# Patient Record
Sex: Female | Born: 1990 | Race: Black or African American | Hispanic: No | Marital: Single | Smoking: Former smoker
Health system: Southern US, Community
[De-identification: ages and names within clinical notes are randomized; demographics above are authoritative.]

## PROBLEM LIST (undated history)

## (undated) ENCOUNTER — Inpatient Hospital Stay (HOSPITAL_COMMUNITY): Payer: Self-pay

## (undated) ENCOUNTER — Inpatient Hospital Stay: Payer: Self-pay

## (undated) DIAGNOSIS — R51 Headache: Secondary | ICD-10-CM

## (undated) DIAGNOSIS — N62 Hypertrophy of breast: Secondary | ICD-10-CM

## (undated) DIAGNOSIS — K8020 Calculus of gallbladder without cholecystitis without obstruction: Secondary | ICD-10-CM

## (undated) HISTORY — PX: LIPOSUCTION EXTREMITIES: SUR830

## (undated) HISTORY — DX: Calculus of gallbladder without cholecystitis without obstruction: K80.20

## (undated) HISTORY — PX: WISDOM TOOTH EXTRACTION: SHX21

## (undated) HISTORY — PX: BREAST SURGERY: SHX581

---
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
## 2008-01-11 ENCOUNTER — Emergency Department: Payer: Self-pay | Admitting: Emergency Medicine

## 2009-04-03 ENCOUNTER — Ambulatory Visit: Payer: Self-pay | Admitting: Unknown Physician Specialty

## 2009-06-23 ENCOUNTER — Emergency Department: Payer: Self-pay | Admitting: Unknown Physician Specialty

## 2009-07-14 ENCOUNTER — Emergency Department: Payer: Self-pay | Admitting: Emergency Medicine

## 2009-08-04 ENCOUNTER — Encounter: Payer: Self-pay | Admitting: Specialist

## 2009-08-12 ENCOUNTER — Encounter: Payer: Self-pay | Admitting: Specialist

## 2010-10-27 ENCOUNTER — Emergency Department: Payer: Self-pay | Admitting: Unknown Physician Specialty

## 2011-08-13 DIAGNOSIS — N62 Hypertrophy of breast: Secondary | ICD-10-CM

## 2011-08-13 HISTORY — DX: Hypertrophy of breast: N62

## 2011-08-17 ENCOUNTER — Encounter (HOSPITAL_BASED_OUTPATIENT_CLINIC_OR_DEPARTMENT_OTHER): Payer: Self-pay | Admitting: *Deleted

## 2011-08-24 ENCOUNTER — Encounter (HOSPITAL_BASED_OUTPATIENT_CLINIC_OR_DEPARTMENT_OTHER): Payer: Self-pay | Admitting: *Deleted

## 2011-08-24 ENCOUNTER — Encounter (HOSPITAL_BASED_OUTPATIENT_CLINIC_OR_DEPARTMENT_OTHER): Payer: Self-pay | Admitting: Anesthesiology

## 2011-08-24 ENCOUNTER — Encounter (HOSPITAL_BASED_OUTPATIENT_CLINIC_OR_DEPARTMENT_OTHER)
Admission: RE | Disposition: A | Discharge status: Home / Self Care | Payer: Self-pay | Source: Ambulatory Visit | Attending: Plastic Surgery

## 2011-08-24 ENCOUNTER — Ambulatory Visit (HOSPITAL_BASED_OUTPATIENT_CLINIC_OR_DEPARTMENT_OTHER)
Admission: RE | Admit: 2011-08-24 | Discharge: 2011-08-24 | Disposition: A | Discharge status: Home / Self Care | Payer: BC Managed Care – PPO | Source: Ambulatory Visit | Attending: Plastic Surgery | Admitting: Plastic Surgery

## 2011-08-24 ENCOUNTER — Ambulatory Visit (HOSPITAL_BASED_OUTPATIENT_CLINIC_OR_DEPARTMENT_OTHER): Payer: BC Managed Care – PPO | Admitting: Anesthesiology

## 2011-08-24 DIAGNOSIS — N62 Hypertrophy of breast: Secondary | ICD-10-CM | POA: Diagnosis present

## 2011-08-24 HISTORY — PX: BREAST REDUCTION SURGERY: SHX8

## 2011-08-24 HISTORY — DX: Headache: R51

## 2011-08-24 HISTORY — DX: Hypertrophy of breast: N62

## 2011-08-24 LAB — POCT HEMOGLOBIN-HEMACUE: Hemoglobin: 12.5 g/dL (ref 12.0–15.0)

## 2011-08-24 SURGERY — MAMMOPLASTY, REDUCTION
Anesthesia: General | Site: Breast | Laterality: Bilateral | Wound class: Clean

## 2011-08-24 MED ORDER — BACITRACIN ZINC 500 UNIT/GM EX OINT
TOPICAL_OINTMENT | CUTANEOUS | Status: DC | PRN
  Administered 2011-08-24: 2 via TOPICAL

## 2011-08-24 MED ORDER — CISATRACURIUM BESYLATE 2 MG/ML IV SOLN
INTRAVENOUS | Status: DC | PRN
  Administered 2011-08-24: 12 mg via INTRAVENOUS

## 2011-08-24 MED ORDER — CEFAZOLIN SODIUM 1-5 GM-% IV SOLN
INTRAVENOUS | Status: DC | PRN
  Administered 2011-08-24: 1 g via INTRAVENOUS

## 2011-08-24 MED ORDER — MIDAZOLAM HCL 5 MG/5ML IJ SOLN
INTRAMUSCULAR | Status: DC | PRN
  Administered 2011-08-24: 2 mg via INTRAVENOUS

## 2011-08-24 MED ORDER — PROPOFOL 10 MG/ML IV EMUL
INTRAVENOUS | Status: DC | PRN
  Administered 2011-08-24: 180 mg via INTRAVENOUS

## 2011-08-24 MED ORDER — OXYCODONE-ACETAMINOPHEN 5-325 MG PO TABS
1.0000 | ORAL_TABLET | ORAL | Status: DC | PRN
  Administered 2011-08-24: 1 via ORAL

## 2011-08-24 MED ORDER — FENTANYL CITRATE 0.05 MG/ML IJ SOLN: 100.0000 ug | Freq: Once | INTRAMUSCULAR | Status: DC

## 2011-08-24 MED ORDER — BUPIVACAINE-EPINEPHRINE 0.5% -1:200000 IJ SOLN
INTRAMUSCULAR | Status: DC | PRN
  Administered 2011-08-24: 40 mL

## 2011-08-24 MED ORDER — HYDROMORPHONE HCL PF 1 MG/ML IJ SOLN
0.2500 mg | INTRAMUSCULAR | Status: DC | PRN
  Administered 2011-08-24 (×3): 0.5 mg via INTRAVENOUS

## 2011-08-24 MED ORDER — ONDANSETRON HCL 4 MG/2ML IJ SOLN: 4.0000 mg | Freq: Once | INTRAMUSCULAR | Status: DC | PRN

## 2011-08-24 MED ORDER — MIDAZOLAM HCL 2 MG/2ML IJ SOLN: 2.0000 mg | Freq: Once | INTRAMUSCULAR | Status: DC

## 2011-08-24 MED ORDER — ONDANSETRON HCL 4 MG/2ML IJ SOLN
INTRAMUSCULAR | Status: DC | PRN
  Administered 2011-08-24: 4 mg via INTRAVENOUS

## 2011-08-24 MED ORDER — LACTATED RINGERS IV SOLN
INTRAVENOUS | Status: DC
  Administered 2011-08-24 (×3): via INTRAVENOUS

## 2011-08-24 MED ORDER — DEXAMETHASONE SODIUM PHOSPHATE 4 MG/ML IJ SOLN
INTRAMUSCULAR | Status: DC | PRN
  Administered 2011-08-24 (×2): 10 mg via INTRAVENOUS

## 2011-08-24 MED ORDER — 0.9 % SODIUM CHLORIDE (POUR BTL) OPTIME
TOPICAL | Status: DC | PRN
  Administered 2011-08-24 (×2): 450 mL

## 2011-08-24 MED ORDER — FENTANYL CITRATE 0.05 MG/ML IJ SOLN
INTRAMUSCULAR | Status: DC | PRN
  Administered 2011-08-24: 50 ug via INTRAVENOUS
  Administered 2011-08-24: 25 ug via INTRAVENOUS
  Administered 2011-08-24: 75 ug via INTRAVENOUS
  Administered 2011-08-24: 25 ug via INTRAVENOUS
  Administered 2011-08-24: 100 ug via INTRAVENOUS
  Administered 2011-08-24: 50 ug via INTRAVENOUS
  Administered 2011-08-24 (×2): 25 ug via INTRAVENOUS
  Administered 2011-08-24: 50 ug via INTRAVENOUS
  Administered 2011-08-24: 25 ug via INTRAVENOUS
  Administered 2011-08-24 (×2): 100 ug via INTRAVENOUS

## 2011-08-24 MED ORDER — LIDOCAINE-EPINEPHRINE 1 %-1:100000 IJ SOLN
INTRAMUSCULAR | Status: DC | PRN
  Administered 2011-08-24: 40 mL

## 2011-08-24 SURGICAL SUPPLY — 58 items
BANDAGE GAUZE ELAST BULKY 4 IN (GAUZE/BANDAGES/DRESSINGS) ×4 IMPLANT
BENZOIN TINCTURE PRP APPL 2/3 (GAUZE/BANDAGES/DRESSINGS) IMPLANT
BLADE KNIFE PERSONA 10 (BLADE) ×8 IMPLANT
BLADE KNIFE PERSONA 15 (BLADE) ×6 IMPLANT
CANISTER SUCTION 1200CC (MISCELLANEOUS) ×2 IMPLANT
CAP BOUFFANT 24 BLUE NURSES (PROTECTIVE WEAR) ×2 IMPLANT
CLOTH BEACON ORANGE TIMEOUT ST (SAFETY) ×2 IMPLANT
COVER MAYO STAND STRL (DRAPES) ×2 IMPLANT
COVER TABLE BACK 60X90 (DRAPES) ×2 IMPLANT
DECANTER SPIKE VIAL GLASS SM (MISCELLANEOUS) ×4 IMPLANT
DRAIN CHANNEL 10F 3/8 F FF (DRAIN) ×4 IMPLANT
DRAPE LAPAROSCOPIC ABDOMINAL (DRAPES) ×2 IMPLANT
DRSG EMULSION OIL 3X3 NADH (GAUZE/BANDAGES/DRESSINGS) ×4 IMPLANT
DRSG PAD ABDOMINAL 8X10 ST (GAUZE/BANDAGES/DRESSINGS) ×4 IMPLANT
ELECT NEEDLE TIP 2.8 STRL (NEEDLE) IMPLANT
ELECT REM PT RETURN 9FT ADLT (ELECTROSURGICAL) ×2
ELECTRODE REM PT RTRN 9FT ADLT (ELECTROSURGICAL) ×1 IMPLANT
EVACUATOR SILICONE 100CC (DRAIN) ×4 IMPLANT
FILTER 7/8 IN (FILTER) ×2 IMPLANT
GLOVE BIO SURGEON STRL SZ 6.5 (GLOVE) ×6 IMPLANT
GLOVE BIOGEL PI IND STRL 6.5 (GLOVE) IMPLANT
GLOVE BIOGEL PI INDICATOR 6.5 (GLOVE)
GLOVE ECLIPSE 6.5 STRL STRAW (GLOVE) ×2 IMPLANT
GOWN PREVENTION PLUS XLARGE (GOWN DISPOSABLE) ×4 IMPLANT
GOWN PREVENTION PLUS XXLARGE (GOWN DISPOSABLE) IMPLANT
NEEDLE HYPO 25X1 1.5 SAFETY (NEEDLE) ×2 IMPLANT
NEEDLE SPNL 18GX3.5 QUINCKE PK (NEEDLE) ×2 IMPLANT
NS IRRIG 1000ML POUR BTL (IV SOLUTION) ×4 IMPLANT
PACK BASIN DAY SURGERY FS (CUSTOM PROCEDURE TRAY) ×2 IMPLANT
PIN SAFETY STERILE (MISCELLANEOUS) ×2 IMPLANT
SCRUB PCMX 4 OZ (MISCELLANEOUS) IMPLANT
SCRUB TECHNI CARE SURGICAL (MISCELLANEOUS) ×2 IMPLANT
SLEEVE SCD COMPRESS KNEE MED (MISCELLANEOUS) ×2 IMPLANT
SLEEVE SURGEON STRL (DRAPES) ×2 IMPLANT
SPECIMEN JAR MEDIUM (MISCELLANEOUS) ×4 IMPLANT
SPECIMEN JAR X LARGE (MISCELLANEOUS) IMPLANT
SPONGE GAUZE 4X4 12PLY (GAUZE/BANDAGES/DRESSINGS) ×4 IMPLANT
SPONGE LAP 18X18 X RAY DECT (DISPOSABLE) ×6 IMPLANT
STAPLER VISISTAT 35W (STAPLE) ×4 IMPLANT
STRIP CLOSURE SKIN 1/2X4 (GAUZE/BANDAGES/DRESSINGS) ×8 IMPLANT
SUT ETHILON 3 0 PS 1 (SUTURE) ×2 IMPLANT
SUT MNCRL AB 3-0 PS2 18 (SUTURE) ×8 IMPLANT
SUT MNCRL AB 4-0 PS2 18 (SUTURE) ×4 IMPLANT
SUT MON AB 5-0 PS2 18 (SUTURE) ×4 IMPLANT
SUT PROLENE 2 0 CT2 30 (SUTURE) ×2 IMPLANT
SUT PROLENE 3 0 PS 1 (SUTURE) ×4 IMPLANT
SUT QUILL PDO 2-0 (SUTURE) ×4 IMPLANT
SYR BULB IRRIGATION 50ML (SYRINGE) ×4 IMPLANT
SYR CONTROL 10ML LL (SYRINGE) ×6 IMPLANT
TOWEL OR 17X24 6PK STRL BLUE (TOWEL DISPOSABLE) ×6 IMPLANT
TOWEL OR NON WOVEN STRL DISP B (DISPOSABLE) ×2 IMPLANT
TRAY DSU PREP LF (CUSTOM PROCEDURE TRAY) ×2 IMPLANT
TRAY FOLEY CATH 14FR (SET/KITS/TRAYS/PACK) ×2 IMPLANT
TUBE CONNECTING 20X1/4 (TUBING) ×2 IMPLANT
UNDERPAD 30X30 INCONTINENT (UNDERPADS AND DIAPERS) ×4 IMPLANT
VAC PENCILS W/TUBING CLEAR (MISCELLANEOUS) ×2 IMPLANT
WATER STERILE IRR 1000ML POUR (IV SOLUTION) ×2 IMPLANT
YANKAUER SUCT BULB TIP NO VENT (SUCTIONS) ×2 IMPLANT

## 2011-08-24 NOTE — Brief Op Note (Signed)
08/24/2011  12:11 PM  PATIENT:  Elizabeth Frey  21 y.o. female  PRE-OPERATIVE DIAGNOSIS:  macromastia  POST-OPERATIVE DIAGNOSIS:  macromastia  PROCEDURE:  Procedure(s) (LRB): MAMMARY REDUCTION BILATERAL (BREAST) (Bilateral)  SURGEON:  Surgeon(s) and Role:    * Lamar Blinks Riggs Dineen, MD - Primary  ASSISTANTS:None  ANESTHESIA:   general  EBL:  Total I/O In: 2200 [I.V.:2200] Out: 1500 [Urine:1100; Blood:400]  DRAINS: (53F) Jackson-Pratt drain(s) with closed bulb suction in the Breast   LOCAL MEDICATIONS USED:  MARCAINE     SPECIMEN:  Source of Specimen:  Breast  DISPOSITION OF SPECIMEN:  PATHOLOGY  COUNTS:  YES  DICTATION: .Other Dictation: Dictation Number [id number]  PLAN OF CARE: Discharge to home after PACU  PATIENT DISPOSITION:  PACU - hemodynamically stable.   Delay start of Pharmacological VTE agent (>24hrs) due to surgical blood loss or risk of bleeding: not applicable

## 2011-08-24 NOTE — Anesthesia Postprocedure Evaluation (Signed)
Anesthesia Post Note  Patient: Elizabeth Frey  Procedure(s) Performed: Procedure(s) (LRB): MAMMARY REDUCTION BILATERAL (BREAST) (Bilateral)  Anesthesia type: General  Patient location: PACU  Post pain: Pain level controlled  Post assessment: Patient's Cardiovascular Status Stable  Last Vitals:  Filed Vitals:   08/24/11 1415  BP: 116/64  Pulse: 103  Temp: 37.1 C  Resp: 20    Post vital signs: Reviewed and stable  Level of consciousness: alert  Complications: No apparent anesthesia complications

## 2011-08-24 NOTE — Discharge Instructions (Signed)
1. No lifting, pushing or pulling with arms greater than 5 lbs for 4 weeks. 2. Empty, strip, record and reactivate JP drains three times a day. 3. Percocet 5/325 tabs 1-2 po q 4-6 hours prn pain (prescription given in office). 4. Duricef 500 mg tabs 1 tab po q 12 hors (prescription given in office). 5. Sterapred dose pack as directed (prescription given in office). 6. Follow-up appointment Friday in office.    About my Jackson-Pratt Bulb Drain  What is a Jackson-Pratt bulb? A Jackson-Pratt is a soft, round device used to collect drainage. It is connected to a long, thin drainage catheter, which is held in place by one or two small stiches near your surgical incision site. When the bulb is squeezed, it forms a vacuum, forcing the drainage to empty into the bulb.  Emptying the Jackson-Pratt bulb- To empty the bulb: 1. Release the plug on the top of the bulb. 2. Pour the bulb's contents into a measuring container which your nurse will provide. 3. Record the time emptied and amount of drainage. Empty the drain(s) as often as your     doctor or nurse recommends.  Date                  Time                    Amount (Drain 1)                 Amount (Drain 2)  _____________________________________________________________________  _____________________________________________________________________  _____________________________________________________________________  _____________________________________________________________________  _____________________________________________________________________  _____________________________________________________________________  _____________________________________________________________________  _____________________________________________________________________  Squeezing the Jackson-Pratt Bulb- To squeeze the bulb: 1. Make sure the plug at the top of the bulb is open. 2. Squeeze the bulb tightly in your fist. You will hear air  squeezing from the bulb. 3. Replace the plug while the bulb is squeezed. 4. Use a safety pin to attach the bulb to your clothing. This will keep the catheter from     pulling at the bulb insertion site.  When to call your doctor- Call your doctor if:  Drain site becomes red, swollen or hot.  You have a fever greater than 101 degrees F.  There is oozing at the drain site.  Drain falls out (apply a guaze bandage over the drain hole and secure it with tape).  Drainage increases daily not related to activity patterns. (You will usually have more drainage when you are active than when you are resting.)  Drainage has a bad odor.

## 2011-08-24 NOTE — Transfer of Care (Signed)
Immediate Anesthesia Transfer of Care Note  Patient: Elizabeth Frey  Procedure(s) Performed: Procedure(s) (LRB): MAMMARY REDUCTION BILATERAL (BREAST) (Bilateral)  Patient Location: PACU  Anesthesia Type: General  Level of Consciousness: sedated  Airway & Oxygen Therapy: Patient Spontanous Breathing and Patient connected to face mask oxygen  Post-op Assessment: Report given to PACU RN and Post -op Vital signs reviewed and stable  Post vital signs: Reviewed and stable  Complications: No apparent anesthesia complications

## 2011-08-24 NOTE — Anesthesia Procedure Notes (Signed)
Procedure Name: Intubation Date/Time: 08/24/2011 8:22 AM Performed by: Gar Gibbon Pre-anesthesia Checklist: Patient identified, Emergency Drugs available, Suction available and Patient being monitored Patient Re-evaluated:Patient Re-evaluated prior to inductionOxygen Delivery Method: Circle system utilized Preoxygenation: Pre-oxygenation with 100% oxygen Intubation Type: IV induction Ventilation: Mask ventilation without difficulty Laryngoscope Size: Mac and 3 Grade View: Grade I Tube type: Oral Tube size: 7.0 mm Number of attempts: 1 Placement Confirmation: ETT inserted through vocal cords under direct vision,  positive ETCO2 and breath sounds checked- equal and bilateral Secured at: 21 cm Tube secured with: Tape Dental Injury: Teeth and Oropharynx as per pre-operative assessment

## 2011-08-24 NOTE — H&P (Signed)
  H&P faxed to surgical center. The patient has been re-examined, the H&P has been reviewed and there is no change in the plan of care.

## 2011-08-24 NOTE — Anesthesia Preprocedure Evaluation (Signed)
Anesthesia Evaluation  Patient identified by MRN, date of birth, ID band Patient awake    Reviewed: Allergy & Precautions, H&P , NPO status   Airway Mallampati: I TM Distance: >3 FB Neck ROM: Full    Dental  (+) Teeth Intact   Pulmonary  breath sounds clear to auscultation        Cardiovascular Rhythm:Regular Rate:Normal     Neuro/Psych    GI/Hepatic   Endo/Other    Renal/GU      Musculoskeletal   Abdominal   Peds  Hematology   Anesthesia Other Findings   Reproductive/Obstetrics                           Anesthesia Physical Anesthesia Plan  ASA: I  Anesthesia Plan: General   Post-op Pain Management:    Induction: Intravenous  Airway Management Planned: Oral ETT  Additional Equipment:   Intra-op Plan:   Post-operative Plan: Extubation in OR  Informed Consent:   Dental advisory given  Plan Discussed with: CRNA and Surgeon  Anesthesia Plan Comments: (ASA 1  Plan GA with ETT  Kipp Brood, MD)        Anesthesia Quick Evaluation

## 2011-08-25 ENCOUNTER — Encounter (HOSPITAL_BASED_OUTPATIENT_CLINIC_OR_DEPARTMENT_OTHER): Payer: Self-pay | Admitting: Plastic Surgery

## 2011-08-25 NOTE — Progress Notes (Signed)
Pt c/o headaches.  States that she is not eating much because her pain medicine seems to be giving her the nausea.  Instructed pt to call Dr Valene Bors office and let them know how she is feeling and see if she can get something for her nausea.

## 2011-08-25 NOTE — Op Note (Signed)
NAME:  Elizabeth Frey, Elizabeth Frey               ACCOUNT NO.:  MEDICAL RECORD NO.:  [id number]  LOCATION:                                 FACILITY:  PHYSICIAN:  Brantley Persons, M.D.DATE OF BIRTH:  1991/04/30  DATE OF PROCEDURE:  08/24/2011 DATE OF DISCHARGE:                              OPERATIVE REPORT   PREOPERATIVE DIAGNOSIS:  Bilateral macromastia.  POSTOPERATIVE DIAGNOSIS:  Bilateral macromastia.  PROCEDURE:  Bilateral reduction mammoplasties.  ATTENDING SURGEON:  Brantley Persons, MD  ANESTHESIA:  General.  ANESTHESIOLOGIST:  Guadalupe Maple, MD  COMPLICATIONS:  None.  FLUID REPLACEMENT:  2000 mL crystalloid.  URINE OUTPUT:  1100 mL.  ESTIMATED BLOOD LOSS:  400 mL.  COMPLICATIONS:  None.  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  She presents to undergo bilateral reduction mammoplasties.  PROCEDURE:  The patient was marked in the preop holding area in the pattern of Wise for the future bilateral reduction mammoplasties.  She was then taken back to the OR and placed on the table in supine position.  After adequate general anesthesia was obtained, the patient's chest was prepped with Techni-Care and draped in sterile fashion.  The bases of the breast had been injected with 1% lidocaine with epinephrine.  After adequate hemostasis and anesthesia taken obtained, the procedure was begun.  Both of the breast reductions were done in the following similar manner. The nipple-areolar complex was marked with 45-mm nipple marker.  The skin was then incised and de-epithelialized around the nipple-areolar complex and taken down to an inframammary crease in inferior pedicle pattern.  Next, the medial, superior, and lateral skin flaps were elevated down to the chest wall.  The excess fat and glandular tissue were removed from the inferior pedicle.  The nipple-areolar  complex was examined and found to be pink and viable.  The wound was then irrigated with saline irrigation.  Meticulous hemostasis was obtained with the Bovie electrocautery.  The inferior pedicle was then centralized using 3- 0 Prolene suture.  A #10 JP flat fully-fluted drain was placed into the wound.  The skin flaps were brought together at the inverted T junction with a 2-0 Prolene suture.  The incision was then stapled for temporary closure.  The breasts were compared and found to have good shape and symmetry.  The incision was then closed from the medial aspect of the JP drain to the medial aspect of the [facility] incision by first placing a few 3- 0 Monocryl sutures to loosely tack together the dermal layer and then both the dermal and cuticular layer were closed in a single layer using a 2-0 Quill PDO barbed suture.  Lateral to the JP drain, the incision was closed using 3-0 Monocryl in the dermal layer followed by 3-0 Monocryl running intracuticular stitch on the skin.  The vertical limb of the Wise pattern was closed in the dermal layer using 3-0 Monocryl suture.  The patient was placed in the upright position.  The future location of the nipple-areolar complex was marked on both breast mounds.  The patient was then placed back into recumbent position.  Both of  the nipple-areolar complexes were brought out onto the breast mounds in the following similar manner.  The skin was incised as marked and removed full thickness into the subcutaneous tissues.  The nipple- areolar complex was examined, found to be pink and viable and then brought out through the aperture sewn in place using 4-0 Monocryl in the dermal layer followed by a 5-0 Monocryl running intracuticular stitch on the skin.  The 5-0 Monocryl suture was then brought down to close the cuticular layer of the vertical limb as well.  The JP drain was sewn in place using 3-0 nylon suture.  Prior to closing the breast wound,  the pectoris major muscle and fascia and surrounding tissues were infiltrated with 0.5% Marcaine with epinephrine, and then the rest of the incision in breast soft tissues were infiltrated with 0.5% Marcaine with epinephrine as well, at this point, to provide a postsurgical anesthetic block.  The incisions were dressed with Steri-Strips and the nipples additionally with bacitracin ointment and Adaptic.  4x4s were placed over the incisions, and the patient was placed into light postoperative support bra.  There were no complications.  The patient tolerated the procedure well.  The final needle and sponge counts were reported to be correct at the end of the case as well.  The patient was taken to recovery room in stable condition.  She was recovered without complications.  Both the patient and her family were given proper postoperative wound care instructions including care of the JP drains.  She was then discharged in the care of her family in stable condition.  Followup appointment will be Friday in the office.          ______________________________ Brantley Persons, M.D.     MC/MEDQ  D:  08/24/2011  T:  08/25/2011  Job:  [id number]

## 2012-03-07 ENCOUNTER — Emergency Department: Payer: Self-pay | Admitting: Emergency Medicine

## 2012-03-07 LAB — CBC WITH DIFFERENTIAL/PLATELET
Basophil #: 0.1 10*3/uL (ref 0.0–0.1)
Lymphocyte #: 3 10*3/uL (ref 1.0–3.6)
Monocyte %: 7 %
Neutrophil #: 6.6 10*3/uL — ABNORMAL HIGH (ref 1.4–6.5)
RBC: 3.81 10*6/uL (ref 3.80–5.20)
RDW: 12.6 % (ref 11.5–14.5)
WBC: 10.5 10*3/uL (ref 3.6–11.0)

## 2012-03-07 LAB — URINALYSIS, COMPLETE
Bilirubin,UR: NEGATIVE
Blood: NEGATIVE
Leukocyte Esterase: NEGATIVE
Nitrite: NEGATIVE
Ph: 5 (ref 4.5–8.0)
Protein: NEGATIVE
RBC,UR: 1 /HPF (ref 0–5)
Squamous Epithelial: 41

## 2012-03-07 LAB — BASIC METABOLIC PANEL
Anion Gap: 10 (ref 7–16)
BUN: 7 mg/dL (ref 7–18)
Creatinine: 0.65 mg/dL (ref 0.60–1.30)
EGFR (African American): >60
EGFR (Non-African Amer.): >60
Sodium: 138 mmol/L (ref 136–145)

## 2012-05-16 ENCOUNTER — Observation Stay: Payer: Self-pay | Admitting: Obstetrics and Gynecology

## 2012-05-17 LAB — URINALYSIS, COMPLETE
Blood: NEGATIVE
Nitrite: NEGATIVE
Protein: NEGATIVE
Specific Gravity: 1.025 (ref 1.003–1.030)

## 2012-07-20 ENCOUNTER — Observation Stay: Payer: Self-pay | Admitting: Obstetrics and Gynecology

## 2012-07-20 LAB — PIH PROFILE
Anion Gap: 7 (ref 7–16)
Co2: 24 mmol/L (ref 21–32)
MCV: 92 fL (ref 80–100)
Osmolality: 277 (ref 275–301)
Potassium: 3 mmol/L — ABNORMAL LOW (ref 3.5–5.1)
Uric Acid: 4.7 mg/dL (ref 2.6–6.0)

## 2012-07-20 LAB — PROTEIN / CREATININE RATIO, URINE: Protein/Creat. Ratio: 192 mg/gCREAT (ref 0–200)

## 2012-07-24 ENCOUNTER — Inpatient Hospital Stay: Payer: Self-pay

## 2012-07-24 LAB — BASIC METABOLIC PANEL
Co2: 23 mmol/L (ref 21–32)
EGFR (Non-African Amer.): >60
Osmolality: 277 (ref 275–301)
Potassium: 3.4 mmol/L — ABNORMAL LOW (ref 3.5–5.1)

## 2012-07-24 LAB — URIC ACID: Uric Acid: 4.6 mg/dL (ref 2.6–6.0)

## 2012-07-24 LAB — CBC WITH DIFFERENTIAL/PLATELET
Eosinophil #: 0 10*3/uL (ref 0.0–0.7)
HGB: 11.5 g/dL — ABNORMAL LOW (ref 12.0–16.0)
Lymphocyte #: 1.7 10*3/uL (ref 1.0–3.6)
Neutrophil #: 4.7 10*3/uL (ref 1.4–6.5)
Neutrophil %: 67.2 %
Platelet: 95 10*3/uL — ABNORMAL LOW (ref 150–440)
RBC: 3.75 10*6/uL — ABNORMAL LOW (ref 3.80–5.20)
WBC: 7 10*3/uL (ref 3.6–11.0)

## 2012-07-25 LAB — PLATELET COUNT: Platelet: 102 10*3/uL — ABNORMAL LOW (ref 150–440)

## 2012-07-26 LAB — HEMATOCRIT: HCT: 27.1 % — ABNORMAL LOW (ref 35.0–47.0)

## 2012-07-28 LAB — PATHOLOGY REPORT

## 2013-08-02 ENCOUNTER — Emergency Department: Payer: Self-pay | Admitting: Internal Medicine

## 2013-08-02 LAB — COMPREHENSIVE METABOLIC PANEL
AST: 21 U/L (ref 15–37)
Albumin: 3.8 g/dL (ref 3.4–5.0)
Alkaline Phosphatase: 132 U/L — ABNORMAL HIGH
Anion Gap: 4 — ABNORMAL LOW (ref 7–16)
BILIRUBIN TOTAL: 1.5 mg/dL — AB (ref 0.2–1.0)
BUN: 11 mg/dL (ref 7–18)
CALCIUM: 9.1 mg/dL (ref 8.5–10.1)
Chloride: 104 mmol/L (ref 98–107)
Co2: 28 mmol/L (ref 21–32)
Creatinine: 0.65 mg/dL (ref 0.60–1.30)
EGFR (African American): >60
Glucose: 89 mg/dL (ref 65–99)
Osmolality: 271 (ref 275–301)
POTASSIUM: 4 mmol/L (ref 3.5–5.1)
SGPT (ALT): 23 U/L (ref 12–78)
Sodium: 136 mmol/L (ref 136–145)
TOTAL PROTEIN: 8.2 g/dL (ref 6.4–8.2)

## 2013-08-02 LAB — CBC
HCT: 39.8 % (ref 35.0–47.0)
HGB: 13.7 g/dL (ref 12.0–16.0)
MCH: 32.2 pg (ref 26.0–34.0)
MCHC: 34.4 g/dL (ref 32.0–36.0)
MCV: 94 fL (ref 80–100)
Platelet: 167 10*3/uL (ref 150–440)
RBC: 4.25 10*6/uL (ref 3.80–5.20)
RDW: 13.3 % (ref 11.5–14.5)
WBC: 9.2 10*3/uL (ref 3.6–11.0)

## 2013-08-02 LAB — URINALYSIS, COMPLETE
BILIRUBIN, UR: NEGATIVE
Bacteria: NONE SEEN
Blood: NEGATIVE
Glucose,UR: NEGATIVE mg/dL (ref 0–75)
Leukocyte Esterase: NEGATIVE
NITRITE: NEGATIVE
PH: 6 (ref 4.5–8.0)
Protein: NEGATIVE
Specific Gravity: 1.015 (ref 1.003–1.030)
WBC UR: 4 /HPF (ref 0–5)

## 2013-08-03 ENCOUNTER — Emergency Department: Payer: Self-pay | Admitting: Emergency Medicine

## 2013-08-03 LAB — CBC WITH DIFFERENTIAL/PLATELET
BASOS PCT: 0.5 %
Basophil #: 0 10*3/uL (ref 0.0–0.1)
EOS ABS: 0 10*3/uL (ref 0.0–0.7)
EOS PCT: 1 %
HCT: 37.5 % (ref 35.0–47.0)
HGB: 12.8 g/dL (ref 12.0–16.0)
LYMPHS PCT: 42.1 %
Lymphocyte #: 1.8 10*3/uL (ref 1.0–3.6)
MCH: 31.9 pg (ref 26.0–34.0)
MCHC: 34.2 g/dL (ref 32.0–36.0)
MCV: 93 fL (ref 80–100)
MONO ABS: 0.7 x10 3/mm (ref 0.2–0.9)
Monocyte %: 15.6 %
NEUTROS PCT: 40.8 %
Neutrophil #: 1.8 10*3/uL (ref 1.4–6.5)
PLATELETS: 168 10*3/uL (ref 150–440)
RBC: 4.02 10*6/uL (ref 3.80–5.20)
RDW: 13.1 % (ref 11.5–14.5)
WBC: 4.3 10*3/uL (ref 3.6–11.0)

## 2013-08-03 LAB — COMPREHENSIVE METABOLIC PANEL
ALBUMIN: 3.5 g/dL (ref 3.4–5.0)
ALK PHOS: 113 U/L
ALT: 18 U/L (ref 12–78)
ANION GAP: 5 — AB (ref 7–16)
AST: 22 U/L (ref 15–37)
BUN: 12 mg/dL (ref 7–18)
Bilirubin,Total: 0.7 mg/dL (ref 0.2–1.0)
CHLORIDE: 107 mmol/L (ref 98–107)
Calcium, Total: 8.2 mg/dL — ABNORMAL LOW (ref 8.5–10.1)
Co2: 28 mmol/L (ref 21–32)
Creatinine: 0.86 mg/dL (ref 0.60–1.30)
EGFR (Non-African Amer.): >60
GLUCOSE: 91 mg/dL (ref 65–99)
Osmolality: 279 (ref 275–301)
Potassium: 3 mmol/L — ABNORMAL LOW (ref 3.5–5.1)
Sodium: 140 mmol/L (ref 136–145)
TOTAL PROTEIN: 7.4 g/dL (ref 6.4–8.2)

## 2013-08-03 LAB — LIPASE, BLOOD: LIPASE: 124 U/L (ref 73–393)

## 2013-08-04 LAB — URINALYSIS, COMPLETE
Bacteria: NONE SEEN
Bilirubin,UR: NEGATIVE
Blood: NEGATIVE
Glucose,UR: NEGATIVE mg/dL (ref 0–75)
KETONE: NEGATIVE
LEUKOCYTE ESTERASE: NEGATIVE
NITRITE: NEGATIVE
PH: 6 (ref 4.5–8.0)
RBC,UR: 2 /HPF (ref 0–5)
SPECIFIC GRAVITY: 1.028 (ref 1.003–1.030)
WBC UR: 4 /HPF (ref 0–5)

## 2014-10-04 NOTE — Op Note (Signed)
PATIENT NAME:  Elizabeth Frey, Elizabeth Frey MR#:  [id number] DATE OF BIRTH:  1991-02-21  DATE OF PROCEDURE:  07/25/2012  PREOPERATIVE DIAGNOSIS: Fetal intolerance to labor.   POSTOPERATIVE DIAGNOSIS: Fetal intolerance to labor.   PROCEDURE: Primary low transverse cesarean section.   SURGEON: Senaida Lange, M.D.   ASSISTANT: Channing Mutters, scrub tech.   ANESTHESIA: Spinal.   ESTIMATED BLOOD LOSS: 500 mL   OPERATIVE FLUIDS:  2300 mL.   COMPLICATIONS: None.   FINDINGS: Vertex female infant 1930 grams, Apgars 9 and 9, normal uterus, tubes and ovaries.   SPECIMENS: Placenta.   INDICATIONS: The patient is a 24 year old who presents for induction of labor for gestational hypertension, IUGR and oligohydramnios who presents for induction of labor. The patient had Cervidil placed the night of admission.  She progressed to 1 cm.  EZ catheter was placed the following morning and Pitocin was started to continue her induction.  The patient got to approximately 5 cm and her fetus began to have decelerations without return to baseline. Therefore the decision was made to proceed towards cesarean section for fetal intolerance to labor.  The risks, benefits indications and alternatives of the procedure were explained and informed consent was obtained.   DESCRIPTION OF PROCEDURE: The patient was taken to the operating room with IV fluids running. She was prepped and draped in the usual sterile fashion with a leftward tilt. Pfannenstiel skin incision was made and carried down to underlying fascia with a knife. The fascia was nicked in the midline. The incision was extended laterally. The superior aspect of the fascia was grasped with Kochers and the underlying rectus muscle dissected off. This was repeated on the inferior fascia. The fascia was entered sharply with the Metzenbaum scissors and the opening was extended. The bladder blade was placed. The vesicouterine peritoneum was grasped with pick-ups and entered  sharply with Metzenbaum scissors. The bladder flap was created digitally. The bladder blade was replaced. A hysterotomy incision was made, carried down to underlying fetal membranes. The opening was extended. The infant's head was grasped and delivered atraumatically through the hysterotomy incision. The mouth and nose were bulb suctioned. The anterior and posterior shoulders were delivered followed by the remainder of the body. The cord was clamped x 2 and cut and the infant was handed to the awaiting neonatologist. The placenta was expressed.   The uterus was exteriorized and cleared of all clot and debris. A right cervical tear was noted.  This was repaired in a running locked fashion as was the remainder of the uterine incision. All surgical areas were found to be hemostatic. The uterus was returned to the abdomen. The abdomen and gutters were irrigated with copious amounts of warm normal saline. The peritoneum was repaired with 2-0 Vicryl. The On-Q pump apparatus was placed according to manufacturer's instructions. The fascia was closed with #1 PDS. The skin was closed with 4-0 Vicryl.  The opening in the skin through which the catheters were placed were secured with Dermabond skin glue. The catheters were bolused with 0.5% Sensorcaine plain x 10 mL and the catheters were secured to the patient's abdomen using Steri-Strips and Tegaderm. The patient tolerated procedure well. Sponge, needle and instrument counts were correct x 2. The patient was taken to the recovery room in stable condition.    ____________________________ Sonda Primes Patton Salles, MD law:ct D: 07/25/2012 11:59:00 ET T: 07/25/2012 12:54:57 ET JOB#: [id number]  cc: Flint Melter A. Patton Salles, MD, <Dictator> Janyth Contes LEE MD ELECTRONICALLY SIGNED 08/02/2012 8:54

## 2014-10-22 NOTE — H&P (Signed)
L&D Evaluation:  History:  HPI 24 yo G2P010 at [redacted]w[redacted]d by [facility] of 08/11/12 presenting from clinic for IOL for HTN, IUGR at 3.5%ile on U/S today.  First elevated BPs were noticed last week and she was hospitalized for preE evaluation at which point she was found to have negative proteinuria and normal labs.  BP in office today 140/102.  She notes a mild HA today but declines VC and RUQ pain.  She notes good FM.  Denies LOF, VB, ctx.  PNC at [facility].  PNL - A+, RI, VI, GBS negative.   Presents with IOL   Patient's Medical History No Chronic Illness   Patient's Surgical History none   Medications Pre Natal Vitamins   Allergies NKDA   Social History none   Family History Non-Contributory   ROS:  ROS per HPI   Exam:  Vital Signs BP 158/89   Urine Protein not completed   General no apparent distress   Mental Status clear   Chest no increased work of breathing   Abdomen gravid, non-tender   Estimated Fetal Weight Small for gestational age   Fetal Position vtx   Edema 1+   Reflexes 2+   Clonus negative   Pelvic cl / 90 / -1   FHT normal rate with no decels   Ucx absent   Skin dry   Other U/S today at [facility]: 3.5%ile with AFI 7cm   Impression:  Impression G2P0 at [redacted]w[redacted]d presents for IOL for likely preE based on elevated BPs and IUGR.   Plan:  Plan EFM/NST, monitor BP, PIH panel   Comments - preE at term - based on elevated BPs and IUGR, 24hr urine results pending (returned to office today), start IOL with cervidil, monitor BPs, will treat for severe range BPs, will consider magnesium in active phase if BPs remain elevated   Electronic Signatures for Addendum Section:  Orvan Falconer (MD) (Signed Addendum 10-Feb-14 14:10)  PreE labs returned WNL, but plts still pending.  BPs remain high mild range (single severe range BP).  Will plan to treat with Magnesium sulfate in active labor and for 24hrs PP, pt understands and is in agreement.  Will  continue to  monitor BPs closely, will treat for persistent severe range.   Electronic Signatures: Garnette Gunner, Ali Lowe (MD)  (Signed 10-Feb-14 11:53)  Authored: L&D Evaluation   Last Updated: 10-Feb-14 14:10 by Garnette Gunner, Ali Lowe (MD)

## 2014-10-22 NOTE — H&P (Signed)
L&D Evaluation:  History:   HPI 24 yo G2P010 at [redacted]w[redacted]d by [facility] of 08/11/12 presenting from clinic for [facility] evaluation.  Her BP in clinic today was 140/100 repeat 130/90.  Negative proteinuria, only 2lbs weight gain in the last week.  THe peatient denies headaches, vision changes, RUQ/epigastric pain, no edema.  +FM, no LOF, no VB, no ctx.    Presents with PIH work up    Patient's Medical History No Chronic Illness    Patient's Surgical History none    Medications Pre Natal Vitamins    Allergies NKDA    Social History none    Family History Non-Contributory   ROS:   ROS All systems were reviewed.  HEENT, CNS, GI, GU, Respiratory, CV, Renal and Musculoskeletal systems were found to be normal.   Exam:   Vital Signs BP 130-150/70-90    Urine Protein not completed, Protein/Cr ratio 192    General no apparent distress    Mental Status clear    Chest no increased work of breathing    Abdomen gravid, non-tender    Estimated Fetal Weight Average for gestational age    Fetal Position vtx    Edema no edema    Reflexes 2+    Clonus negative    FHT normal rate with no decels    Ucx absent   Impression:   Impression evaluation for PIH   Plan:   Plan EFM/NST, monitor BP, PIH panel    Comments 1) Elevated BP - based on negative proteinuria likely represents gestational hypertension.  The patient will complete an outpatient 24-hr urine protein. Discussed pro's and con's of starting antihypertensive therapy.  At this point will hold off, if patient were to develop severe range BP's would recommend proceeding with IOL based on meeting criteria for preE based on BP's.    2) Fetus - cateogry I/reactive tracing.  WIll set up for NST/AFI surveillance in clinic, growth scan, and HROB clinic follow up on 05/23/13.  3) Disposition - discharge home with clinic follow up as above.  Strict pre-eclampsia and labor precautions.   Electronic Signatures: Lorrene Reid (MD)   (Signed 07-Feb-14 21:01)  Authored: L&D Evaluation   Last Updated: 07-Feb-14 21:01 by Lorrene Reid (MD)

## 2015-07-02 ENCOUNTER — Encounter: Payer: Self-pay | Admitting: Emergency Medicine

## 2015-07-02 DIAGNOSIS — Z3202 Encounter for pregnancy test, result negative: Secondary | ICD-10-CM | POA: Insufficient documentation

## 2015-07-02 DIAGNOSIS — R1013 Epigastric pain: Secondary | ICD-10-CM | POA: Insufficient documentation

## 2015-07-02 DIAGNOSIS — G43909 Migraine, unspecified, not intractable, without status migrainosus: Secondary | ICD-10-CM | POA: Insufficient documentation

## 2015-07-02 LAB — CBC WITH DIFFERENTIAL/PLATELET
BASOS ABS: 0.1 10*3/uL (ref 0–0.1)
BASOS PCT: 1 %
EOS ABS: 0.1 10*3/uL (ref 0–0.7)
Eosinophils Relative: 1 %
HCT: 39 % (ref 35.0–47.0)
HEMOGLOBIN: 13 g/dL (ref 12.0–16.0)
Lymphocytes Relative: 35 %
Lymphs Abs: 3.4 10*3/uL (ref 1.0–3.6)
MCH: 30.6 pg (ref 26.0–34.0)
MCHC: 33.4 g/dL (ref 32.0–36.0)
MCV: 91.9 fL (ref 80.0–100.0)
MONOS PCT: 7 %
Monocytes Absolute: 0.6 10*3/uL (ref 0.2–0.9)
NEUTROS ABS: 5.4 10*3/uL (ref 1.4–6.5)
Neutrophils Relative %: 56 %
Platelets: 182 10*3/uL (ref 150–440)
RBC: 4.25 MIL/uL (ref 3.80–5.20)
RDW: 12.6 % (ref 11.5–14.5)
WBC: 9.5 10*3/uL (ref 3.6–11.0)

## 2015-07-02 LAB — COMPREHENSIVE METABOLIC PANEL
ALT: 24 U/L (ref 14–54)
ANION GAP: 7 (ref 5–15)
AST: 16 U/L (ref 15–41)
Albumin: 4 g/dL (ref 3.5–5.0)
Alkaline Phosphatase: 80 U/L (ref 38–126)
BILIRUBIN TOTAL: 1.1 mg/dL (ref 0.3–1.2)
BUN: 19 mg/dL (ref 6–20)
CALCIUM: 9.1 mg/dL (ref 8.9–10.3)
CO2: 23 mmol/L (ref 22–32)
Chloride: 110 mmol/L (ref 101–111)
Creatinine, Ser: 0.9 mg/dL (ref 0.44–1.00)
Glucose, Bld: 91 mg/dL (ref 65–99)
Potassium: 3.6 mmol/L (ref 3.5–5.1)
Sodium: 140 mmol/L (ref 135–145)
TOTAL PROTEIN: 7.7 g/dL (ref 6.5–8.1)

## 2015-07-02 LAB — URINALYSIS COMPLETE WITH MICROSCOPIC (ARMC ONLY)
BILIRUBIN URINE: NEGATIVE
Bacteria, UA: NONE SEEN
GLUCOSE, UA: NEGATIVE mg/dL
HGB URINE DIPSTICK: NEGATIVE
KETONES UR: NEGATIVE mg/dL
LEUKOCYTES UA: NEGATIVE
NITRITE: NEGATIVE
PH: 8 (ref 5.0–8.0)
Protein, ur: NEGATIVE mg/dL
SPECIFIC GRAVITY, URINE: 1.023 (ref 1.005–1.030)

## 2015-07-02 LAB — LIPASE, BLOOD: Lipase: 23 U/L (ref 11–51)

## 2015-07-02 LAB — POCT PREGNANCY, URINE: Preg Test, Ur: NEGATIVE

## 2015-07-02 NOTE — ED Notes (Signed)
Pt presents to ED with migraine headache with blurred vision for the past several hours. Hx of the same. Pt states her pain was starting to make her feel like she was going to pass out so she came to be evaluated. Pt also reports having severe stabbing epigastric pain that feels like something she ate "is stuck" for a few days. Denies n/v/d. Pt has taken otc medication earlier with no relief. Pt currently has no increased work of breathing or acute distress noted at this time. Skin warm and dry.

## 2015-07-03 ENCOUNTER — Emergency Department
Admission: EM | Admit: 2015-07-03 | Discharge: 2015-07-03 | Discharge status: Against Medical Advice | Payer: Self-pay | Attending: Emergency Medicine | Admitting: Emergency Medicine

## 2016-11-29 ENCOUNTER — Encounter: Payer: Self-pay | Admitting: *Deleted

## 2016-11-29 ENCOUNTER — Emergency Department
Admission: EM | Admit: 2016-11-29 | Discharge: 2016-11-29 | Disposition: A | Discharge status: Home / Self Care | Payer: Self-pay | Attending: Emergency Medicine | Admitting: Emergency Medicine

## 2016-11-29 DIAGNOSIS — K219 Gastro-esophageal reflux disease without esophagitis: Secondary | ICD-10-CM | POA: Insufficient documentation

## 2016-11-29 DIAGNOSIS — K2970 Gastritis, unspecified, without bleeding: Secondary | ICD-10-CM | POA: Insufficient documentation

## 2016-11-29 DIAGNOSIS — R1013 Epigastric pain: Secondary | ICD-10-CM

## 2016-11-29 LAB — URINALYSIS, COMPLETE (UACMP) WITH MICROSCOPIC
Bacteria, UA: NONE SEEN
Bilirubin Urine: NEGATIVE
GLUCOSE, UA: NEGATIVE mg/dL
Ketones, ur: NEGATIVE mg/dL
Leukocytes, UA: NEGATIVE
Nitrite: NEGATIVE
PH: 5 (ref 5.0–8.0)
PROTEIN: NEGATIVE mg/dL
Specific Gravity, Urine: 1.024 (ref 1.005–1.030)

## 2016-11-29 LAB — CBC
HCT: 39.7 % (ref 35.0–47.0)
Hemoglobin: 13.7 g/dL (ref 12.0–16.0)
MCH: 32.4 pg (ref 26.0–34.0)
MCHC: 34.5 g/dL (ref 32.0–36.0)
MCV: 94 fL (ref 80.0–100.0)
Platelets: 159 10*3/uL (ref 150–440)
RBC: 4.23 MIL/uL (ref 3.80–5.20)
RDW: 12.5 % (ref 11.5–14.5)
WBC: 7.5 10*3/uL (ref 3.6–11.0)

## 2016-11-29 LAB — COMPREHENSIVE METABOLIC PANEL
ALK PHOS: 66 U/L (ref 38–126)
ALT: 18 U/L (ref 14–54)
AST: 20 U/L (ref 15–41)
Albumin: 4 g/dL (ref 3.5–5.0)
Anion gap: 4 — ABNORMAL LOW (ref 5–15)
BUN: 12 mg/dL (ref 6–20)
CALCIUM: 8.8 mg/dL — AB (ref 8.9–10.3)
CO2: 23 mmol/L (ref 22–32)
Chloride: 111 mmol/L (ref 101–111)
Creatinine, Ser: 0.79 mg/dL (ref 0.44–1.00)
GFR calc non Af Amer: >60 mL/min (ref >60)
Glucose, Bld: 87 mg/dL (ref 65–99)
Potassium: 3.8 mmol/L (ref 3.5–5.1)
SODIUM: 138 mmol/L (ref 135–145)
Total Bilirubin: 0.7 mg/dL (ref 0.3–1.2)
Total Protein: 7.6 g/dL (ref 6.5–8.1)

## 2016-11-29 LAB — LIPASE, BLOOD: Lipase: 26 U/L (ref 11–51)

## 2016-11-29 MED ORDER — FAMOTIDINE 20 MG PO TABS
40.0000 mg | ORAL_TABLET | Freq: Once | ORAL | Status: AC
  Administered 2016-11-29: 40 mg via ORAL
  Filled 2016-11-29: qty 2

## 2016-11-29 MED ORDER — ONDANSETRON 4 MG PO TBDP: 4.0000 mg | ORAL_TABLET | Freq: Three times a day (TID) | ORAL | 0 refills | Status: DC | PRN

## 2016-11-29 MED ORDER — GI COCKTAIL ~~LOC~~
30.0000 mL | ORAL | Status: AC
  Administered 2016-11-29: 30 mL via ORAL
  Filled 2016-11-29: qty 30

## 2016-11-29 MED ORDER — CALCIUM CARBONATE ANTACID 500 MG PO CHEW: 2.0000 | CHEWABLE_TABLET | Freq: Three times a day (TID) | ORAL | 0 refills | Status: DC | PRN

## 2016-11-29 MED ORDER — FAMOTIDINE 20 MG PO TABS: 20.0000 mg | ORAL_TABLET | Freq: Two times a day (BID) | ORAL | 0 refills | Status: DC

## 2016-11-29 NOTE — ED Provider Notes (Signed)
[facility] Emergency Department Provider Note  ____________________________________________  Time seen: Approximately 4:19 PM  I have reviewed the triage vital signs and the nursing notes.   HISTORY  Chief Complaint Abdominal Pain    HPI Elizabeth Frey is a 26 y.o. female who complains of epigastric burning pain for the past 2-3 months. It comes in waves intermittently. She sometimes gets nausea but no vomiting at all. No diarrhea or constipation. Nonradiating. No aggravating or alleviating factors. Tolerating oral intake just fine. She's not noticed anything specifically that seems to cause it or make it better. When present is moderate to severe in intensity.     Past Medical History:  Diagnosis Date  . Depo-Provera contraceptive status    last Depo injection was 02/2011 - periods have not resumed  . Headache(784.0)    non-migraine; every 1-2 days  . 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     Patient Active Problem List   Diagnosis Date Noted  . Macromastia 08/24/2011     Past Surgical History:  Procedure Laterality Date  . BREAST REDUCTION SURGERY  08/24/2011   Procedure: MAMMARY REDUCTION BILATERAL (BREAST);  Surgeon: Karie Fetch, MD;  Location: [facility];  Service: Plastics;  Laterality: Bilateral;  . CESAREAN SECTION    . DILATION AND CURETTAGE OF UTERUS  2008  . WISDOM TOOTH EXTRACTION       Prior to Admission medications   Medication Sig Start Date End Date Taking? Authorizing Provider  calcium carbonate (TUMS) 500 MG chewable tablet Chew 2 tablets (400 mg of elemental calcium total) by mouth 3 (three) times daily as needed for indigestion or heartburn. 11/29/16 11/29/17  Sharman Cheek, MD  famotidine (PEPCID) 20 MG tablet Take 1 tablet (20 mg total) by mouth 2 (two) times daily. 11/29/16   Sharman Cheek, MD  ondansetron (ZOFRAN ODT) 4 MG disintegrating tablet Take 1 tablet (4 mg total) by mouth every 8 (eight) hours as  needed for nausea or vomiting. 11/29/16   Sharman Cheek, MD     Allergies Patient has no known allergies.   History reviewed. No pertinent family history.  Social History Social History  Substance Use Topics  . Smoking status: Never Smoker  . Smokeless tobacco: Never Used  . Alcohol use No    Review of Systems  Constitutional:   No fever or chills.  ENT:   No sore throat. No rhinorrhea. Cardiovascular:   No chest pain or syncope. Respiratory:   No dyspnea or cough. Gastrointestinal:   Positive as above for abdominal pain without vomiting and diarrhea.  Musculoskeletal:   Negative for focal pain or swelling All other systems reviewed and are negative except as documented above in ROS and HPI.  ____________________________________________   PHYSICAL EXAM:  VITAL SIGNS: ED Triage Vitals  Enc Vitals Group     BP 11/29/16 1224 138/88     Pulse Rate 11/29/16 1224 70     Resp 11/29/16 1224 16     Temp 11/29/16 1224 98.8 F (37.1 C)     Temp Source 11/29/16 1224 Oral     SpO2 11/29/16 1224 100 %     Weight 11/29/16 1222 160 lb (72.6 kg)     Height 11/29/16 1222 5\' 3"  (1.6 m)     Head Circumference --      Peak Flow --      Pain Score 11/29/16 1221 10     Pain Loc --      Pain Edu? --  Excl. in GC? --     Vital signs reviewed, nursing assessments reviewed.   Constitutional:   Alert and oriented. Well appearing and in no distress. Eyes:   No scleral icterus.  EOMI. No nystagmus. No conjunctival pallor. PERRL. ENT   Head:   Normocephalic and atraumatic.   Nose:   No congestion/rhinnorhea.    Mouth/Throat:   MMM, Mild pharyngeal erythema. No peritonsillar mass.    Neck:   No meningismus. Full ROM Hematological/Lymphatic/Immunilogical:   No cervical lymphadenopathy. Cardiovascular:   RRR. Symmetric bilateral radial and DP pulses.  No murmurs.  Respiratory:   Normal respiratory effort without tachypnea/retractions. Breath sounds are clear and  equal bilaterally. No wheezes/rales/rhonchi. Gastrointestinal:   Soft with mild epigastric tenderness. Non distended. There is no CVA tenderness.  No rebound, rigidity, or guarding. Negative Murphy sign Genitourinary:   deferred Musculoskeletal:   Normal range of motion in all extremities. No joint effusions.  No lower extremity tenderness.  No edema. Neurologic:   Normal speech and language.  Motor grossly intact. No gross focal neurologic deficits are appreciated.  Skin:    Skin is warm, dry and intact. No rash noted.  No petechiae, purpura, or bullae.  ____________________________________________    LABS (pertinent positives/negatives) (all labs ordered are listed, but only abnormal results are displayed) Labs Reviewed  COMPREHENSIVE METABOLIC PANEL - Abnormal; Notable for the following:       Result Value   Calcium 8.8 (*)    Anion gap 4 (*)    All other components within normal limits  URINALYSIS, COMPLETE (UACMP) WITH MICROSCOPIC - Abnormal; Notable for the following:    Color, Urine YELLOW (*)    APPearance CLEAR (*)    Hgb urine dipstick SMALL (*)    Squamous Epithelial / LPF 0-5 (*)    All other components within normal limits  LIPASE, BLOOD  CBC   ____________________________________________   EKG  Interpreted by me  Date: 11/29/2016  Rate: 59  Rhythm: normal sinus rhythm  QRS Axis: normal  Intervals: normal  ST/T Wave abnormalities: normal  Conduction Disutrbances: none  Narrative Interpretation: unremarkable      ____________________________________________    RADIOLOGY  No results found.  ____________________________________________   PROCEDURES Procedures  ____________________________________________   INITIAL IMPRESSION / ASSESSMENT AND PLAN / ED COURSE  Pertinent labs & imaging results that were available during my care of the patient were reviewed by me and considered in my medical decision making (see chart for details).  Patient  presents with epigastric pain with some mild tenderness in that area. Her symptoms are otherwise atypical. She is very well-appearing with normal vital signs.Considering the patient's symptoms, medical history, and physical examination today, I have low suspicion for cholecystitis or biliary pathology, pancreatitis, perforation or bowel obstruction, hernia, intra-abdominal abscess, AAA or dissection, volvulus or intussusception, mesenteric ischemia, or appendicitis.  Plan to treat as GERD and gastritis with Tums Zofran and Pepcid. Follow up with primary care in a week. Currently no imaging indicated.      ____________________________________________   FINAL CLINICAL IMPRESSION(S) / ED DIAGNOSES  Final diagnoses:  Epigastric pain  Gastroesophageal reflux disease, esophagitis presence not specified      New Prescriptions   CALCIUM CARBONATE (TUMS) 500 MG CHEWABLE TABLET    Chew 2 tablets (400 mg of elemental calcium total) by mouth 3 (three) times daily as needed for indigestion or heartburn.   FAMOTIDINE (PEPCID) 20 MG TABLET    Take 1 tablet (20 mg total) by mouth  2 (two) times daily.   ONDANSETRON (ZOFRAN ODT) 4 MG DISINTEGRATING TABLET    Take 1 tablet (4 mg total) by mouth every 8 (eight) hours as needed for nausea or vomiting.     Portions of this note were generated with dragon dictation software. Dictation errors may occur despite best attempts at proofreading.    Sharman Cheek, MD 11/29/16 1623

## 2016-11-29 NOTE — Discharge Instructions (Signed)
It was a pleasure to take care of you today, and thank you for coming to our emergency department.  If you have any questions or concerns before leaving please ask the nurse to grab me and I'm more than happy to go through your aftercare instructions again.  If you have any concerns once you are home that you are not improving or are in fact getting worse before you can make it to your follow-up appointment, please do not hesitate to call 911 and come back for further evaluation.  You should return to the emergency room immediately if you have new or severe symptoms such as chest pain, difficulty breathing, passing out, high fever, severe pain, or unremitting vomiting.   Available test results from today are listed below.   Sharman Cheek, MD  Results for orders placed or performed during the hospital encounter of 11/29/16  Lipase, blood  Result Value Ref Range   Lipase 26 11 - 51 U/L  Comprehensive metabolic panel  Result Value Ref Range   Sodium 138 135 - 145 mmol/L   Potassium 3.8 3.5 - 5.1 mmol/L   Chloride 111 101 - 111 mmol/L   CO2 23 22 - 32 mmol/L   Glucose, Bld 87 65 - 99 mg/dL   BUN 12 6 - 20 mg/dL   Creatinine, Ser 7.82 0.44 - 1.00 mg/dL   Calcium 8.8 (L) 8.9 - 10.3 mg/dL   Total Protein 7.6 6.5 - 8.1 g/dL   Albumin 4.0 3.5 - 5.0 g/dL   AST 20 15 - 41 U/L   ALT 18 14 - 54 U/L   Alkaline Phosphatase 66 38 - 126 U/L   Total Bilirubin 0.7 0.3 - 1.2 mg/dL   GFR calc non Af Amer >60 >60 mL/min   GFR calc Af Amer >60 >60 mL/min   Anion gap 4 (L) 5 - 15  CBC  Result Value Ref Range   WBC 7.5 3.6 - 11.0 K/uL   RBC 4.23 3.80 - 5.20 MIL/uL   Hemoglobin 13.7 12.0 - 16.0 g/dL   HCT 95.6 21.3 - 08.6 %   MCV 94.0 80.0 - 100.0 fL   MCH 32.4 26.0 - 34.0 pg   MCHC 34.5 32.0 - 36.0 g/dL   RDW 57.8 46.9 - 62.9 %   Platelets 159 150 - 440 K/uL  Urinalysis, Complete w Microscopic  Result Value Ref Range   Color, Urine YELLOW (A) YELLOW   APPearance CLEAR (A) CLEAR   Specific  Gravity, Urine 1.024 1.005 - 1.030   pH 5.0 5.0 - 8.0   Glucose, UA NEGATIVE NEGATIVE mg/dL   Hgb urine dipstick SMALL (A) NEGATIVE   Bilirubin Urine NEGATIVE NEGATIVE   Ketones, ur NEGATIVE NEGATIVE mg/dL   Protein, ur NEGATIVE NEGATIVE mg/dL   Nitrite NEGATIVE NEGATIVE   Leukocytes, UA NEGATIVE NEGATIVE   RBC / HPF 0-5 0 - 5 RBC/hpf   WBC, UA 0-5 0 - 5 WBC/hpf   Bacteria, UA NONE SEEN NONE SEEN   Squamous Epithelial / LPF 0-5 (A) NONE SEEN   Mucous PRESENT    No results found.

## 2016-11-29 NOTE — ED Triage Notes (Signed)
Pt states upper abd, epigastric burning for months, states pain has increased of recent, denies any nausea or vomiting, denies any pain in relation to eating, awake and alert in no acute distress, states no relief with OTC meds

## 2017-02-07 ENCOUNTER — Encounter: Payer: Self-pay | Admitting: Emergency Medicine

## 2017-02-07 ENCOUNTER — Emergency Department
Admission: EM | Admit: 2017-02-07 | Discharge: 2017-02-07 | Disposition: A | Discharge status: Home / Self Care | Payer: Medicaid Other | Attending: Emergency Medicine | Admitting: Emergency Medicine

## 2017-02-07 DIAGNOSIS — Z3201 Encounter for pregnancy test, result positive: Secondary | ICD-10-CM | POA: Insufficient documentation

## 2017-02-07 DIAGNOSIS — Z79899 Other long term (current) drug therapy: Secondary | ICD-10-CM | POA: Diagnosis not present

## 2017-02-07 DIAGNOSIS — R112 Nausea with vomiting, unspecified: Secondary | ICD-10-CM

## 2017-02-07 DIAGNOSIS — Z3A00 Weeks of gestation of pregnancy not specified: Secondary | ICD-10-CM | POA: Diagnosis not present

## 2017-02-07 DIAGNOSIS — O210 Mild hyperemesis gravidarum: Secondary | ICD-10-CM | POA: Insufficient documentation

## 2017-02-07 LAB — CBC
HCT: 36.7 % (ref 35.0–47.0)
Hemoglobin: 12.9 g/dL (ref 12.0–16.0)
MCH: 32.5 pg (ref 26.0–34.0)
MCHC: 35.1 g/dL (ref 32.0–36.0)
MCV: 92.5 fL (ref 80.0–100.0)
PLATELETS: 184 10*3/uL (ref 150–440)
RBC: 3.97 MIL/uL (ref 3.80–5.20)
RDW: 11.9 % (ref 11.5–14.5)
WBC: 8.1 10*3/uL (ref 3.6–11.0)

## 2017-02-07 LAB — URINALYSIS, COMPLETE (UACMP) WITH MICROSCOPIC
Bilirubin Urine: NEGATIVE
GLUCOSE, UA: NEGATIVE mg/dL
HGB URINE DIPSTICK: NEGATIVE
Ketones, ur: 5 mg/dL — AB
LEUKOCYTES UA: NEGATIVE
NITRITE: NEGATIVE
PROTEIN: NEGATIVE mg/dL
Specific Gravity, Urine: 1.024 (ref 1.005–1.030)
pH: 7 (ref 5.0–8.0)

## 2017-02-07 LAB — COMPREHENSIVE METABOLIC PANEL
ALBUMIN: 3.8 g/dL (ref 3.5–5.0)
ALK PHOS: 60 U/L (ref 38–126)
ALT: 16 U/L (ref 14–54)
AST: 17 U/L (ref 15–41)
Anion gap: 7 (ref 5–15)
BILIRUBIN TOTAL: 1 mg/dL (ref 0.3–1.2)
BUN: 10 mg/dL (ref 6–20)
CALCIUM: 9 mg/dL (ref 8.9–10.3)
CO2: 22 mmol/L (ref 22–32)
CREATININE: 0.63 mg/dL (ref 0.44–1.00)
Chloride: 107 mmol/L (ref 101–111)
GFR calc Af Amer: >60 mL/min (ref >60)
GLUCOSE: 87 mg/dL (ref 65–99)
POTASSIUM: 3.5 mmol/L (ref 3.5–5.1)
Sodium: 136 mmol/L (ref 135–145)
TOTAL PROTEIN: 7.5 g/dL (ref 6.5–8.1)

## 2017-02-07 LAB — LIPASE, BLOOD: Lipase: 17 U/L (ref 11–51)

## 2017-02-07 LAB — POCT PREGNANCY, URINE: Preg Test, Ur: POSITIVE — AB

## 2017-02-07 LAB — HCG, QUANTITATIVE, PREGNANCY: HCG, BETA CHAIN, QUANT, S: 21224 m[IU]/mL — AB (ref <5)

## 2017-02-07 MED ORDER — ONDANSETRON 4 MG PO TBDP
ORAL_TABLET | ORAL | 0 refills | Status: DC
Start: 2017-02-07 — End: 2017-03-21

## 2017-02-07 MED ORDER — ONDANSETRON 4 MG PO TBDP: 4.0000 mg | ORAL_TABLET | Freq: Once | ORAL | Status: DC

## 2017-02-07 NOTE — ED Provider Notes (Signed)
[facility] Emergency Department Provider Note  ____________________________________________   First MD Initiated Contact with Patient 02/07/17 1537     (approximate)  I have reviewed the triage vital signs and the nursing notes.   HISTORY  Chief Complaint Abdominal Pain    HPI Elizabeth Frey is a 26 y.o. female Who presents for evaluation of persistent nausea and vomiting over the last few days.  She states that she is hungry, but certain smells seem stronger than usual and whenever she tries to eat she vomits.  She has not been able to tolerate much by mouth for the last few days.  possibly as a result of the vomiting she is having some lower abdominal cramping pain but it is mild.   Nothing in particular makes the patient's symptoms better and strong smells seem to make the nausea and vomiting worse.  She has one 62-year-old child and that pregnancy was uncomplicated.  She reports that her last menstrual period was approximately one month ago (about 7/21) and she is overdue for her next one.  She is sexually active with one long-term partner.  She denies fever/chills, chest pain, shortness of breath, dysuria, and has not recently had any excessive vaginal itching, burning, no dischar  No vaginal bleeding.   Past Medical History:  Diagnosis Date  . Depo-Provera contraceptive status    last Depo injection was 02/2011 - periods have not resumed  . Headache(784.0)    non-migraine; every 1-2 days  . 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 Macromastia 08/24/2011    Past Surgical History:  Procedure Laterality Date  . BREAST REDUCTION SURGERY  08/24/2011   Procedure: MAMMARY REDUCTION BILATERAL (BREAST);  Surgeon: Karie Fetch, MD;  Location: [facility];  Service: Plastics;  Laterality: Bilateral;  . CESAREAN SECTION    . DILATION AND CURETTAGE OF UTERUS  2008  . WISDOM TOOTH EXTRACTION       Prior to Admission medications   Medication Sig Start Date End Date Taking? Authorizing Provider  calcium carbonate (TUMS) 500 MG chewable tablet Chew 2 tablets (400 mg of elemental calcium total) by mouth 3 (three) times daily as needed for indigestion or heartburn. 11/29/16 11/29/17  Sharman Cheek, MD  famotidine (PEPCID) 20 MG tablet Take 1 tablet (20 mg total) by mouth 2 (two) times daily. 11/29/16   Sharman Cheek, MD  ondansetron (ZOFRAN ODT) 4 MG disintegrating tablet Allow 1-2 tablets to dissolve in your mouth every 8 hours as needed for nausea/vomiting 02/07/17   Loleta Rose, MD    Allergies Patient has no known allergies.  History reviewed. No pertinent family history.  Social History Social History  Substance Use Topics  . Smoking status: Never Smoker  . Smokeless tobacco: Never Used  . Alcohol use No    Review of Systems Constitutional: No fever/chills Eyes: No visual changes. ENT: No sore throat. Cardiovascular: Denies chest pain. Respiratory: Denies shortness of breath. Gastrointestinal: No abdominal pain.  No nausea, no vomiting.  No diarrhea.  No constipation. Genitourinary: Negative for dysuria. Musculoskeletal: Negative for neck pain.  Negative for back pain. Integumentary: Negative for rash. Neurological: Negative for headaches, focal weakness or numbness.   ____________________________________________   PHYSICAL EXAM:  VITAL SIGNS: ED Triage Vitals  Enc Vitals Group     BP 02/07/17 1217 127/68     Pulse Rate 02/07/17 1217 77     Resp 02/07/17 1458 20  Temp 02/07/17 1217 99.2 F (37.3 C)     Temp Source 02/07/17 1217 Oral     SpO2 02/07/17 1217 100 %     Weight 02/07/17 1217 72.6 kg (160 lb)     Height 02/07/17 1217 1.6 m (5\' 3" )     Head Circumference --      Peak Flow --      Pain Score 02/07/17 1216 9     Pain Loc --      Pain Edu? --      Excl. in GC? --     Constitutional: Alert and oriented. Well appearing and in no  acute distress. Eyes: Conjunctivae are normal.  Head: Atraumatic. Nose: No congestion/rhinnorhea. Mouth/Throat: Mucous membranes are moist. Neck: No stridor.  No meningeal signs.   Cardiovascular: Normal rate, regular rhythm. Good peripheral circulation. Grossly normal heart sounds. Respiratory: Normal respiratory effort.  No retractions. Lungs CTAB. Gastrointestinal: Soft and nontender. No distention.  Genitourinary: deferred Musculoskeletal: No lower extremity tenderness nor edema. No gross deformities of extremities. Neurologic:  Normal speech and language. No gross focal neurologic deficits are appreciated.  Skin:  Skin is warm, dry and intact. No rash noted. Psychiatric: Mood and affect are normal. Speech and behavior are normal.  ____________________________________________   LABS (all labs ordered are listed, but only abnormal results are displayed)  Labs Reviewed  URINALYSIS, COMPLETE (UACMP) WITH MICROSCOPIC - Abnormal; Notable for the following:       Result Value   Color, Urine YELLOW (*)    APPearance CLOUDY (*)    Ketones, ur 5 (*)    Bacteria, UA RARE (*)    Squamous Epithelial / LPF 6-30 (*)    All other components within normal limits  POCT PREGNANCY, URINE - Abnormal; Notable for the following:    Preg Test, Ur POSITIVE (*)    All other components within normal limits  URINE CULTURE  LIPASE, BLOOD  COMPREHENSIVE METABOLIC PANEL  CBC  HCG, QUANTITATIVE, PREGNANCY  POC URINE PREG, ED   ____________________________________________  EKG  None - EKG not ordered by ED physician ____________________________________________  RADIOLOGY   No results found.  ____________________________________________   PROCEDURES  Critical Care performed: No   Procedure(s) performed:   Procedures   ____________________________________________   INITIAL IMPRESSION / ASSESSMENT AND PLAN / ED COURSE  Pertinent labs & imaging results that were available during  my care of the patient were reviewed by me and considered in my medical decision making (see chart for details).  he patient is well-appearing and in no acute distress.  Even though she has been vomiting a lot she does not appear dehydrated and her lab results are reassuring.  A member of the staff had already updated her about her positive urine pregnancy test.  We had a discusion about hyperemesis. Given that she is not having any otherconcerning signs or symptoms and she has no tenderness to palpation of her abdomen at this time, I do not feel that a pelvicexam would be beneficial for her and she understands and agrees.  She previously went to [facility] for her prenatal care so she will call and set up an appointment.  I will add on an hCG to establish a baseline to assist with follow-up.  I am giving her a Zofran ODTand we will try by mouth challenge.  As long as she is able to take fluids and saltby mouth she agrees that the best plan is discharge  and outpatient follow-up.  I gave  my usual and customary return precautions.      Clinical Course as of Feb 07 1601  Mon Feb 07, 2017  1559 Patient states she needs to go now to pick up her 51-year-old and does not want to wait for PO challenge.  Providing paperwork and Zofran Rx.  [CF]    Clinical Course User Index [CF] Loleta Rose, MD    ____________________________________________  FINAL CLINICAL IMPRESSION(S) / ED DIAGNOSES  Final diagnoses:  Hyperemesis gravidarum  Non-intractable vomiting with nausea, unspecified vomiting type     MEDICATIONS GIVEN DURING THIS VISIT:  Medications  ondansetron (ZOFRAN-ODT) disintegrating tablet 4 mg (not administered)     NEW OUTPATIENT MEDICATIONS STARTED DURING THIS VISIT:  New Prescriptions   ONDANSETRON (ZOFRAN ODT) 4 MG DISINTEGRATING TABLET    Allow 1-2 tablets to dissolve in your mouth every 8 hours as needed for nausea/vomiting    Modified Medications   No medications on file     Discontinued Medications   ONDANSETRON (ZOFRAN ODT) 4 MG DISINTEGRATING TABLET    Take 1 tablet (4 mg total) by mouth every 8 (eight) hours as needed for nausea or vomiting.     Note:  This document was prepared using Dragon voice recognition software and may include unintentional dictation errors.    Loleta Rose, MD 02/07/17 [phone]

## 2017-02-07 NOTE — ED Notes (Signed)
Pt to ed with c/o lower abd pain and n/v x 1 week.  Pt reports nausea today.  Reports last episode of vomiting this am. Pt appears in nad, skin warm and dry.

## 2017-02-07 NOTE — ED Triage Notes (Signed)
Pt to ed with c/o lower abd pain and n/v x 1 week.  Pt reports nausea today.  Reports last episode of vomiting this am.

## 2017-02-07 NOTE — Discharge Instructions (Signed)
As we discussed,you are in the very early stages of pregnancy which likely is the cause of your nausea and vomiting.  Please read through the included information and follow the recommendations.  Use the prescribed nausea medicine as needed. please start taking prenatal vitamins and call [facility] to establish prenatal care.  Return to the emergency department if you develop new or worsening symptoms that concern you.

## 2017-02-09 LAB — URINE CULTURE: SPECIAL REQUESTS: NORMAL

## 2017-03-01 ENCOUNTER — Encounter: Payer: Self-pay | Admitting: Emergency Medicine

## 2017-03-01 DIAGNOSIS — O23599 Infection of other part of genital tract in pregnancy, unspecified trimester: Secondary | ICD-10-CM | POA: Insufficient documentation

## 2017-03-01 DIAGNOSIS — O9989 Other specified diseases and conditions complicating pregnancy, childbirth and the puerperium: Secondary | ICD-10-CM | POA: Insufficient documentation

## 2017-03-01 DIAGNOSIS — R102 Pelvic and perineal pain: Secondary | ICD-10-CM | POA: Diagnosis not present

## 2017-03-01 DIAGNOSIS — Z79899 Other long term (current) drug therapy: Secondary | ICD-10-CM | POA: Diagnosis not present

## 2017-03-01 DIAGNOSIS — Z3A10 10 weeks gestation of pregnancy: Secondary | ICD-10-CM | POA: Diagnosis not present

## 2017-03-01 LAB — CBC
HCT: 37.5 % (ref 35.0–47.0)
HEMOGLOBIN: 13.1 g/dL (ref 12.0–16.0)
MCH: 31.6 pg (ref 26.0–34.0)
MCHC: 34.9 g/dL (ref 32.0–36.0)
MCV: 90.6 fL (ref 80.0–100.0)
Platelets: 226 10*3/uL (ref 150–440)
RBC: 4.14 MIL/uL (ref 3.80–5.20)
RDW: 12.2 % (ref 11.5–14.5)
WBC: 11.4 10*3/uL — ABNORMAL HIGH (ref 3.6–11.0)

## 2017-03-01 LAB — URINALYSIS, COMPLETE (UACMP) WITH MICROSCOPIC
Bacteria, UA: NONE SEEN
Bilirubin Urine: NEGATIVE
GLUCOSE, UA: NEGATIVE mg/dL
HGB URINE DIPSTICK: NEGATIVE
Ketones, ur: 20 mg/dL — AB
Leukocytes, UA: NEGATIVE
NITRITE: NEGATIVE
PH: 6 (ref 5.0–8.0)
Protein, ur: 100 mg/dL — AB
RBC / HPF: NONE SEEN RBC/hpf (ref 0–5)
Specific Gravity, Urine: 1.028 (ref 1.005–1.030)

## 2017-03-01 LAB — COMPREHENSIVE METABOLIC PANEL
ALK PHOS: 59 U/L (ref 38–126)
ALT: 17 U/L (ref 14–54)
ANION GAP: 8 (ref 5–15)
AST: 18 U/L (ref 15–41)
Albumin: 3.7 g/dL (ref 3.5–5.0)
BUN: 8 mg/dL (ref 6–20)
CALCIUM: 9.1 mg/dL (ref 8.9–10.3)
CO2: 24 mmol/L (ref 22–32)
Chloride: 105 mmol/L (ref 101–111)
Creatinine, Ser: 0.57 mg/dL (ref 0.44–1.00)
GFR calc non Af Amer: >60 mL/min (ref >60)
Glucose, Bld: 84 mg/dL (ref 65–99)
Potassium: 3 mmol/L — ABNORMAL LOW (ref 3.5–5.1)
SODIUM: 137 mmol/L (ref 135–145)
TOTAL PROTEIN: 8 g/dL (ref 6.5–8.1)
Total Bilirubin: 1.1 mg/dL (ref 0.3–1.2)

## 2017-03-01 LAB — POCT PREGNANCY, URINE: Preg Test, Ur: POSITIVE — AB

## 2017-03-01 LAB — HCG, QUANTITATIVE, PREGNANCY: hCG, Beta Chain, Quant, S: 163430 m[IU]/mL — ABNORMAL HIGH (ref <5)

## 2017-03-01 NOTE — ED Triage Notes (Addendum)
Patient ambulatory to triage with steady gait, without difficulty or distress noted; pt reports N/V, approx [redacted]wks pregnant with abd cramping; st was seen for same recently and rx zofran without relief

## 2017-03-02 ENCOUNTER — Emergency Department
Admission: EM | Admit: 2017-03-02 | Discharge: 2017-03-02 | Disposition: A | Discharge status: Home / Self Care | Payer: Medicaid Other | Attending: Emergency Medicine | Admitting: Emergency Medicine

## 2017-03-02 ENCOUNTER — Emergency Department: Payer: Medicaid Other

## 2017-03-02 DIAGNOSIS — B9689 Other specified bacterial agents as the cause of diseases classified elsewhere: Secondary | ICD-10-CM

## 2017-03-02 DIAGNOSIS — R109 Unspecified abdominal pain: Secondary | ICD-10-CM

## 2017-03-02 DIAGNOSIS — O26899 Other specified pregnancy related conditions, unspecified trimester: Secondary | ICD-10-CM

## 2017-03-02 DIAGNOSIS — Z3491 Encounter for supervision of normal pregnancy, unspecified, first trimester: Secondary | ICD-10-CM

## 2017-03-02 DIAGNOSIS — R1110 Vomiting, unspecified: Secondary | ICD-10-CM

## 2017-03-02 DIAGNOSIS — N760 Acute vaginitis: Secondary | ICD-10-CM

## 2017-03-02 LAB — WET PREP, GENITAL
SPERM: NONE SEEN
TRICH WET PREP: NONE SEEN
YEAST WET PREP: NONE SEEN

## 2017-03-02 LAB — CHLAMYDIA/NGC RT PCR (ARMC ONLY)
CHLAMYDIA TR: NOT DETECTED
N GONORRHOEAE: NOT DETECTED

## 2017-03-02 MED ORDER — PROMETHAZINE HCL 25 MG PO TABS
12.5000 mg | ORAL_TABLET | Freq: Once | ORAL | Status: AC
  Administered 2017-03-02: 12.5 mg via ORAL
  Filled 2017-03-02: qty 1

## 2017-03-02 MED ORDER — PROMETHAZINE HCL 12.5 MG PO TABS: 12.5000 mg | ORAL_TABLET | Freq: Four times a day (QID) | ORAL | 0 refills | Status: DC | PRN

## 2017-03-02 MED ORDER — SODIUM CHLORIDE 0.9 % IV BOLUS (SEPSIS)
1000.0000 mL | Freq: Once | INTRAVENOUS | Status: AC
  Administered 2017-03-02: 1000 mL via INTRAVENOUS

## 2017-03-02 MED ORDER — POTASSIUM CHLORIDE CRYS ER 20 MEQ PO TBCR
40.0000 meq | EXTENDED_RELEASE_TABLET | Freq: Once | ORAL | Status: AC
  Administered 2017-03-02: 40 meq via ORAL
  Filled 2017-03-02: qty 2

## 2017-03-02 MED ORDER — ONDANSETRON HCL 4 MG/2ML IJ SOLN
4.0000 mg | Freq: Once | INTRAMUSCULAR | Status: AC
  Administered 2017-03-02: 4 mg via INTRAVENOUS
  Filled 2017-03-02: qty 2

## 2017-03-02 MED ORDER — METRONIDAZOLE 500 MG PO TABS: 500.0000 mg | ORAL_TABLET | Freq: Three times a day (TID) | ORAL | 0 refills | Status: DC

## 2017-03-02 NOTE — ED Notes (Signed)
Snack given to Pt, per MD orders.

## 2017-03-02 NOTE — ED Notes (Signed)
Pt went to ultrasound.

## 2017-03-02 NOTE — Discharge Instructions (Signed)
Please follow up closely with obstetrics and gynecology or your primary doctor as planned on Tuesday this coming week, or sooner if possible.  Return to the emergency room if your symptoms worsen, you become weak and dizzy or lightheaded, you have an episode of passing out, develop any bleeding, develop abdominal or pelvic pain, fevers chills or other new concerns arise.

## 2017-03-02 NOTE — ED Provider Notes (Signed)
[facility] Emergency Department Provider Note   ____________________________________________   First MD Initiated Contact with Patient 03/02/17 0015     (approximate)  I have reviewed the triage vital signs and the nursing notes.   HISTORY  Chief Complaint Abdominal Pain    HPI Elizabeth Frey is a 26 y.o. female who has previously evaluated in the ER 2 weeks ago for nausea and vomiting.  Patient reports he's been having ongoing discomfort with frequent nausea and vomiting during this pregnancy. For about the last 2 weeks she is experiencing intermittent lower abdominal cramping which she feels is sort of like that of a "menstrual cramp or lighter."she has not seen any loss of fluid. Denies fevers or chills. Denies any persistent abdominal pain or upper abdominal pain. No pain under the right rib cage. Does not radiate to the back  She reports no previous complications or fertility treatments. [facility]  Patient reports she is here to the ongoing discomfort she has experienced and persistent nausea with occasional vomiting that she experiences throughout the day. She is able to eat and drink, but reports nausea and feeling dehydrated as her feel lightheaded at times when she is standing     Past Medical History:  Diagnosis Date  . Depo-Provera contraceptive status    last Depo injection was 02/2011 - periods have not resumed  . Headache(784.0)    non-migraine; every 1-2 days  . 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 Macromastia 08/24/2011    Past Surgical History:  Procedure Laterality Date  . BREAST REDUCTION SURGERY  08/24/2011   Procedure: MAMMARY REDUCTION BILATERAL (BREAST);  Surgeon: Karie Fetch, MD;  Location: [facility];  Service: Plastics;  Laterality: Bilateral;  . CESAREAN SECTION    . DILATION AND CURETTAGE OF UTERUS   2008  . WISDOM TOOTH EXTRACTION      Prior to Admission medications   Medication Sig Start Date End Date Taking? Authorizing Provider  calcium carbonate (TUMS) 500 MG chewable tablet Chew 2 tablets (400 mg of elemental calcium total) by mouth 3 (three) times daily as needed for indigestion or heartburn. 11/29/16 11/29/17  Sharman Cheek, MD  famotidine (PEPCID) 20 MG tablet Take 1 tablet (20 mg total) by mouth 2 (two) times daily. 11/29/16   Sharman Cheek, MD  metroNIDAZOLE (FLAGYL) 500 MG tablet Take 1 tablet (500 mg total) by mouth 3 (three) times daily. 03/02/17   Sharyn Creamer, MD  ondansetron (ZOFRAN ODT) 4 MG disintegrating tablet Allow 1-2 tablets to dissolve in your mouth every 8 hours as needed for nausea/vomiting 02/07/17   Loleta Rose, MD  promethazine (PHENERGAN) 12.5 MG tablet Take 1 tablet (12.5 mg total) by mouth every 6 (six) hours as needed for nausea or vomiting. 03/02/17   Sharyn Creamer, MD    Allergies Patient has no known allergies.  No family history on file.  Social History Social History  Substance Use Topics  . Smoking status: Never Smoker  . Smokeless tobacco: Never Used  . Alcohol use No    Review of Systems Constitutional: No fever/chills Eyes: No visual changes. ENT: No sore throat. Cardiovascular: Denies chest pain. Respiratory: Denies shortness of breath. Gastrointestinal: No diarrhea.  No constipation. Genitourinary: Negative for dysuria.see history of present illness Musculoskeletal: Negative for back pain. Skin: Negative for rash. Neurological: Negative for headaches, focal weakness or numbness.    ____________________________________________  PHYSICAL EXAM:  VITAL SIGNS: ED Triage Vitals  Enc Vitals Group     BP 03/01/17 2134 124/77     Pulse Rate 03/01/17 2134 79     Resp 03/01/17 2134 20     Temp 03/01/17 2134 97.9 F (36.6 C)     Temp Source 03/01/17 2134 Oral     SpO2 03/01/17 2134 100 %     Weight 03/01/17 2135 150 lb (68  kg)     Height 03/01/17 2135  (1.6 m)     Head Circumference --      Peak Flow --      Pain Score 03/01/17 2133 10     Pain Loc --      Pain Edu? --      Excl. in GC? --     Constitutional: Alert and oriented. Well appearing and in no acute distress. Eyes: Conjunctivae are normal. Head: Atraumatic. Nose: No congestion/rhinnorhea. Mouth/Throat: Mucous membranes are moist. Neck: No stridor.   Cardiovascular: Normal rate, regular rhythm. Grossly normal heart sounds.  Good peripheral circulation. Respiratory: Normal respiratory effort.  No retractions. Lungs CTAB. Gastrointestinal: Soft and nontender. No distention.likely minimally gravid with fundus palpable at about the level of the pubic crest. Minimal discomfort suprapubically. No pain at McBurney's point. No peritoneal signs. Musculoskeletal: No lower extremity tenderness nor edema. Neurologic:  Normal speech and language. No gross focal neurologic deficits are appreciated.  Skin:  Skin is warm, dry and intact. No rash noted. Psychiatric: Mood and affect are normal. Speech and behavior are normal. genitourinary exam performed with Laurey Arrow. Normal external exam. Internal exam demonstrates slight thin whitish discharge, no purulence. No discomfort or pain to exam. ____________________________________________   LABS (all labs ordered are listed, but only abnormal results are displayed)  Labs Reviewed  WET PREP, GENITAL - Abnormal; Notable for the following:       Result Value   Clue Cells Wet Prep HPF POC PRESENT (*)    WBC, Wet Prep HPF POC FEW (*)    All other components within normal limits  CBC - Abnormal; Notable for the following:    WBC 11.4 (*)    All other components within normal limits  COMPREHENSIVE METABOLIC PANEL - Abnormal; Notable for the following:    Potassium 3.0 (*)    All other components within normal limits  HCG, QUANTITATIVE, PREGNANCY - Abnormal; Notable for the following:    hCG, Beta Chain,  Quant, S 163,430 (*)    All other components within normal limits  URINALYSIS, COMPLETE (UACMP) WITH MICROSCOPIC - Abnormal; Notable for the following:    Color, Urine YELLOW (*)    APPearance HAZY (*)    Ketones, ur 20 (*)    Protein, ur 100 (*)    Squamous Epithelial / LPF 6-30 (*)    All other components within normal limits  POCT PREGNANCY, URINE - Abnormal; Notable for the following:    Preg Test, Ur POSITIVE (*)    All other components within normal limits  CHLAMYDIA/NGC RT PCR ([facility] ONLY)   labs reviewed, some ketones and mild low K. ____________________________________________  EKG   ____________________________________________  RADIOLOGY  US Ob Comp < 14 Wks  Result Date: 03/02/2017 CLINICAL DATA:  Abdominal cramping, nausea, and vomiting. Estimated gestational age by LMP is 10 weeks 2 days. Quantitative beta HCG yesterday was 163,430 EXAM: OBSTETRIC <14 WK [place] AND TRANSVAGINAL OB US TECHNIQUE: Both transabdominal and transvaginal ultrasound examinations were performed for complete evaluation of the  gestation as well as the maternal uterus, adnexal regions, and pelvic cul-de-sac. Transvaginal technique was performed to assess early pregnancy. COMPARISON:  None. FINDINGS: Intrauterine gestational sac: A single intrauterine gestational sac is present. Yolk sac:  The yolk sac is visualized. Embryo:  Fetal pole is identified. Cardiac Activity: Fetal cardiac activity is observed. Heart Rate: 173  bpm CRL:  24.5  mm   9 w   1 d                  [place] EDC: 10/04/2017 Subchorionic hemorrhage:  None visualized. Maternal uterus/adnexae: Uterus is anteverted. No myometrial mass lesions are identified. Both ovaries are visualized and appear normal. Small corpus luteal cyst on the left. No free fluid in the pelvis. IMPRESSION: Single intrauterine pregnancy. Estimated gestational age by crown-rump length is 9 weeks 1 day. No acute complication is demonstrated sonographically.  Electronically Signed   By: Burman Nieves M.D.   On: 03/02/2017 01:50   US Ob Transvaginal  Result Date: 03/02/2017 CLINICAL DATA:  Abdominal cramping, nausea, and vomiting. Estimated gestational age by LMP is 10 weeks 2 days. Quantitative beta HCG yesterday was 163,430 EXAM: OBSTETRIC <14 WK [place] AND TRANSVAGINAL OB US TECHNIQUE: Both transabdominal and transvaginal ultrasound examinations were performed for complete evaluation of the gestation as well as the maternal uterus, adnexal regions, and pelvic cul-de-sac. Transvaginal technique was performed to assess early pregnancy. COMPARISON:  None. FINDINGS: Intrauterine gestational sac: A single intrauterine gestational sac is present. Yolk sac:  The yolk sac is visualized. Embryo:  Fetal pole is identified. Cardiac Activity: Fetal cardiac activity is observed. Heart Rate: 173  bpm CRL:  24.5  mm   9 w   1 d                  [place] EDC: 10/04/2017 Subchorionic hemorrhage:  None visualized. Maternal uterus/adnexae: Uterus is anteverted. No myometrial mass lesions are identified. Both ovaries are visualized and appear normal. Small corpus luteal cyst on the left. No free fluid in the pelvis. IMPRESSION: Single intrauterine pregnancy. Estimated gestational age by crown-rump length is 9 weeks 1 day. No acute complication is demonstrated sonographically. Electronically Signed   By: Burman Nieves M.D.   On: 03/02/2017 01:50     reviewed, single IUP. Gestational age about 9 weeks 1 day. No signs of an ectopic. ____________________________________________   PROCEDURES  Procedure(s) performed: None  Procedures  Critical Care performed: No  ____________________________________________   INITIAL IMPRESSION / ASSESSMENT AND PLAN / ED COURSE  Pertinent labs & imaging results that were available during my care of the patient were reviewed by me and considered in my medical decision making (see chart for details).  lower abdominal  with nausea and  occasional vomiting. Nontoxic and well-appearing on clinical exam. Does not appear clinically dehydrated or distress. Normal hemodynamics. Reports taking Zofran, but still having significant nausea and vomiting at times. Reassuring abdominal exam with no acute abdomen. Positive hCG is reviewed, slightly low potassium which we will replete. Positive for intrauterine pregnancy.  Clinical Course as of Mar 02 358  Wed Mar 02, 2017  0015 Preg Test, Ur: (!) POSITIVE [MQ]  0015 + preg test. HCG rising HCG, Beta Chain, [place] Longest 161,096 [MQ]  0015 Potassium: (!) 3.0 [MQ]    Clinical Course User Index [MQ] Sharyn Creamer, MD    ----------------------------------------- 3:59 AM on 03/02/2017 -----------------------------------------  Tolerated by mouth well. Patient agreeable with treatment recommendations, discussed antibiotics and Phenergan. Patient will be  following up closely with gynecology Tuesday if not sooner. Return precautions and treatment recommendations and follow-up discussed with the patient who is agreeable with the plan.  ____________________________________________   FINAL CLINICAL IMPRESSION(S) / ED DIAGNOSES  Final diagnoses:  Hyperemesis  First trimester pregnancy  BV (bacterial vaginosis)      NEW MEDICATIONS STARTED DURING THIS VISIT:  New Prescriptions   METRONIDAZOLE (FLAGYL) 500 MG TABLET    Take 1 tablet (500 mg total) by mouth 3 (three) times daily.   PROMETHAZINE (PHENERGAN) 12.5 MG TABLET    Take 1 tablet (12.5 mg total) by mouth every 6 (six) hours as needed for nausea or vomiting.     Note:  This document was prepared using Dragon voice recognition software and may include unintentional dictation errors.     Sharyn Creamer, MD 03/02/17 [phone]

## 2017-03-02 NOTE — ED Notes (Signed)
Pt was able to tolerate food and fluids. Dr. Fanny Bien was notified.

## 2017-03-02 NOTE — ED Notes (Signed)
Pt returned from ultrasound

## 2017-03-08 ENCOUNTER — Ambulatory Visit: Payer: Medicaid Other | Admitting: Obstetrics and Gynecology

## 2017-03-08 ENCOUNTER — Encounter: Payer: Self-pay | Admitting: Obstetrics and Gynecology

## 2017-03-08 ENCOUNTER — Ambulatory Visit (INDEPENDENT_AMBULATORY_CARE_PROVIDER_SITE_OTHER): Payer: Medicaid Other | Admitting: Certified Nurse Midwife

## 2017-03-08 VITALS — BP 122/82 | HR 94 | Ht 63.0 in | Wt 146.6 lb

## 2017-03-08 DIAGNOSIS — N926 Irregular menstruation, unspecified: Secondary | ICD-10-CM | POA: Diagnosis not present

## 2017-03-08 LAB — POCT URINE PREGNANCY: PREG TEST UR: POSITIVE — AB

## 2017-03-08 MED ORDER — DOXYLAMINE-PYRIDOXINE ER 20-20 MG PO TBCR: 1.0000 | EXTENDED_RELEASE_TABLET | Freq: Two times a day (BID) | ORAL | 4 refills | Status: DC

## 2017-03-08 NOTE — Progress Notes (Signed)
Subjective:   Subjective:    Elizabeth Frey is a 26 y.o. female who presents for evaluation of amenorrhea. She believes she could be pregnant. Pregnancy is desired. Sexual Activity: single partner, contraception: none. Current symptoms also include: nausea. Last period was normal.   Patient's last menstrual period was 01/01/2017. The following portions of the patient's history were reviewed and updated as appropriate: allergies, current medications, past family history, past medical history, past social history, past surgical history and problem list.  Review of Systems Pertinent items are noted in HPI.     Objective:    BP 122/82   Pulse 94   Ht 5\' 3"  (1.6 m)   Wt 146 lb 9.6 oz (66.5 kg)   LMP 01/01/2017   BMI 25.97 kg/m  General: alert, cooperative, appears stated age, no distress and no acute distress    Lab Review Urine HCG: positive    Assessment:    Absence of menstruation.     Plan:  Positive: EDC: 10/08/17. Briefly discussed pre-natal care options. Pregnancy, Childbirth and the Newborn book given. Encouraged well-balanced diet, plenty of rest when needed, pre-natal vitamins daily and walking for exercise. Discussed self-help for nausea, avoiding OTC medications until consulting provider or pharmacist, other than Tylenol as needed, minimal caffeine (1-2 cups daily) and avoiding alcohol. Datin and viability Ultrasound to be done ASAP.  She will schedule her initial nurse visit at 10 wks then her NOB physical exam  @ 12 wks.. Feel free to call with any questions.   Doreene Burke, CNM

## 2017-03-08 NOTE — Patient Instructions (Signed)

## 2017-03-14 ENCOUNTER — Ambulatory Visit (INDEPENDENT_AMBULATORY_CARE_PROVIDER_SITE_OTHER): Payer: Medicaid Other

## 2017-03-14 DIAGNOSIS — N926 Irregular menstruation, unspecified: Secondary | ICD-10-CM | POA: Diagnosis not present

## 2017-03-21 ENCOUNTER — Ambulatory Visit (INDEPENDENT_AMBULATORY_CARE_PROVIDER_SITE_OTHER): Payer: Medicaid Other | Admitting: Certified Nurse Midwife

## 2017-03-21 ENCOUNTER — Encounter: Payer: Self-pay | Admitting: Certified Nurse Midwife

## 2017-03-21 VITALS — BP 117/83 | HR 74 | Wt 148.1 lb

## 2017-03-21 DIAGNOSIS — Z113 Encounter for screening for infections with a predominantly sexual mode of transmission: Secondary | ICD-10-CM

## 2017-03-21 DIAGNOSIS — Z3481 Encounter for supervision of other normal pregnancy, first trimester: Secondary | ICD-10-CM

## 2017-03-21 LAB — OB RESULTS CONSOLE VARICELLA ZOSTER ANTIBODY, IGG: Varicella: IMMUNE

## 2017-03-21 MED ORDER — METRONIDAZOLE 0.75 % VA GEL: 1.0000 | Freq: Every day | VAGINAL | 0 refills | Status: DC

## 2017-03-21 MED ORDER — TERCONAZOLE 0.4 % VA CREA: 1.0000 | TOPICAL_CREAM | Freq: Every day | VAGINAL | 0 refills | Status: AC

## 2017-03-21 NOTE — Progress Notes (Signed)
NEW OB HISTORY AND PHYSICAL  SUBJECTIVE:       Elizabeth Frey is a 26 y.o. G2P1 female, Patient's last menstrual period was 01/06/2017., Estimated Date of Delivery: 10/08/17, [redacted]w[redacted]d, presents today for establishment of Prenatal Care. She states that she has had increase amount of white itchy discharge.    Gynecologic History Patient's last menstrual period was 01/06/2017. Normal Contraception: none Last Pap: 2017. Results were: normal, per pt. No results on file  Obstetric History OB History  Gravida Para Term Preterm AB Living  2 1          SAB TAB Ectopic Multiple Live Births               # Outcome Date GA Lbr Len/2nd Weight Sex Delivery Anes PTL Lv  2 Current           1 Para 2014    F CS-Unspec         Past Medical History:  Diagnosis Date  . Depo-Provera contraceptive status    last Depo injection was 02/2011 - periods have not resumed  . Headache(784.0)    non-migraine; every 1-2 days  . 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 BREAST REDUCTION SURGERY  08/24/2011   Procedure: MAMMARY REDUCTION BILATERAL (BREAST);  Surgeon: Karie Fetch, MD;  Location: [facility];  Service: Plastics;  Laterality: Bilateral;  . CESAREAN SECTION    . DILATION AND CURETTAGE OF UTERUS  2008  . WISDOM TOOTH EXTRACTION      Current Outpatient Prescriptions on File Prior to Visit  Medication Sig Dispense Refill  . Doxylamine-Pyridoxine ER (BONJESTA) 20-20 MG TBCR Take 1 capsule by mouth 2 (two) times daily. 62 tablet 4  . promethazine (PHENERGAN) 12.5 MG tablet Take 1 tablet (12.5 mg total) by mouth every 6 (six) hours as needed for nausea or vomiting. 12 tablet 0   No current facility-administered medications on file prior to visit.     No Known Allergies  Social History   Social History  . Marital status: Single    Spouse name: N/A  . Number of children: N/A  . Years of education: N/A   Occupational History  . Not  on file.   Social History Main Topics  . Smoking status: Never Smoker  . Smokeless tobacco: Never Used  . Alcohol use No  . Drug use: No  . Sexual activity: Not Currently   Other Topics Concern  . Not on file   Social History Narrative  . No narrative on file    History reviewed. No pertinent family history.  The following portions of the patient's history were reviewed and updated as appropriate: allergies, current medications, past OB history, past medical history, past surgical history, past family history, past social history, and problem list.    OBJECTIVE: Initial Physical Exam (New OB)  GENERAL APPEARANCE: alert, well appearing, in no apparent distress, oriented to person, place and time HEAD: normocephalic, atraumatic MOUTH: mucous membranes moist, pharynx normal without lesions THYROID: no thyromegaly or masses present BREASTS: no masses noted, no significant tenderness, no palpable axillary nodes, no skin changes, bilateral scar from breast reduction surgery and bilateral nipple piercing  LUNGS: clear to auscultation, no wheezes, rales or rhonchi, symmetric air entry HEART: regular rate and rhythm, no murmurs ABDOMEN: soft, nontender, nondistended, no abnormal masses, no epigastric pain EXTREMITIES: no redness or tenderness in the calves or thighs, no edema, no limitation in range of  motion, intact peripheral pulses SKIN: normal coloration and turgor, no rashes LYMPH NODES: no adenopathy palpable NEUROLOGIC: alert, oriented, normal speech, no focal findings or movement disorder noted  PELVIC EXAM EXTERNAL GENITALIA: normal appearing vulva with no masses, tenderness or lesions VAGINA: no abnormal discharge or lesions, thick white particulate discharge CERVIX: no lesions or cervical motion tenderness, contact bleeding with pap smear UTERUS: gravid ADNEXA: no masses palpable and nontender OB EXAM PELVIMETRY: appears adequate RECTUM: exam not  indicated  ASSESSMENT: Normal pregnancy  PLAN: New OB counseling: The patient has been given an overview regarding routine prenatal care. Recommendations regarding diet, weight gain, and exercise in pregnancy were given. Prenatal testing, optional genetic testing, and carrier screening reviewed. Ultrasound use in pregnancy were reviewed. Benefits of Breast Feeding were discussed. The patient is encouraged to consider nursing her baby post partum. ROB in 4 wks.  Doreene Burke, CNM

## 2017-03-21 NOTE — Progress Notes (Signed)
Nob PE- Dx with BV at [facility] given flagyl pills can not tolerate. Last pap- 2017 wnl. NO h/o abn.

## 2017-03-21 NOTE — Patient Instructions (Signed)

## 2017-03-21 NOTE — Addendum Note (Signed)
Addended by: Mechele Claude on: 03/21/2017 12:24 PM   Modules accepted: Orders

## 2017-03-22 LAB — RUBELLA SCREEN: RUBELLA: 7.63 {index} (ref >0.99)

## 2017-03-22 LAB — CBC WITH DIFFERENTIAL/PLATELET
BASOS: 0 %
Basophils Absolute: 0 10*3/uL (ref 0.0–0.2)
EOS (ABSOLUTE): 0 10*3/uL (ref 0.0–0.4)
EOS: 0 %
HEMATOCRIT: 38.3 % (ref 34.0–46.6)
Hemoglobin: 12.5 g/dL (ref 11.1–15.9)
IMMATURE GRANS (ABS): 0.1 10*3/uL (ref 0.0–0.1)
IMMATURE GRANULOCYTES: 1 %
Lymphocytes Absolute: 2.7 10*3/uL (ref 0.7–3.1)
Lymphs: 27 %
MCH: 30.9 pg (ref 26.6–33.0)
MCHC: 32.6 g/dL (ref 31.5–35.7)
MCV: 95 fL (ref 79–97)
MONOS ABS: 0.6 10*3/uL (ref 0.1–0.9)
Monocytes: 6 %
NEUTROS ABS: 6.4 10*3/uL (ref 1.4–7.0)
Neutrophils: 66 %
PLATELETS: 252 10*3/uL (ref 150–379)
RBC: 4.04 x10E6/uL (ref 3.77–5.28)
RDW: 12.5 % (ref 12.3–15.4)
WBC: 9.9 10*3/uL (ref 3.4–10.8)

## 2017-03-22 LAB — HIV ANTIBODY (ROUTINE TESTING W REFLEX): HIV Screen 4th Generation wRfx: NONREACTIVE

## 2017-03-22 LAB — VARICELLA ZOSTER ANTIBODY, IGG: Varicella zoster IgG: 609 index (ref >165)

## 2017-03-22 LAB — ABO AND RH: Rh Factor: POSITIVE

## 2017-03-22 LAB — ANTIBODY SCREEN: Antibody Screen: NEGATIVE

## 2017-03-22 LAB — RPR: RPR: NONREACTIVE

## 2017-03-22 LAB — SICKLE CELL SCREEN: SICKLE CELL SCREEN: NEGATIVE

## 2017-03-22 LAB — HEPATITIS B SURFACE ANTIGEN: Hepatitis B Surface Ag: NEGATIVE

## 2017-03-23 ENCOUNTER — Encounter: Payer: Self-pay | Admitting: Certified Nurse Midwife

## 2017-03-23 LAB — PAP IG, CT-NG, RFX HPV ASCU
CHLAMYDIA, NUC. ACID AMP: NEGATIVE
GONOCOCCUS BY NUCLEIC ACID AMP: NEGATIVE
PAP Smear Comment: 0

## 2017-03-23 LAB — URINE CULTURE, OB REFLEX

## 2017-03-23 LAB — CULTURE, OB URINE

## 2017-03-26 LAB — DRUG PROFILE, UR, 9 DRUGS (LABCORP)
Amphetamines, Urine: NEGATIVE ng/mL
BENZODIAZEPINE QUANT UR: NEGATIVE ng/mL
Barbiturate Quant, Ur: NEGATIVE ng/mL
Cannabinoid Quant, Ur: POSITIVE — AB
Cocaine (Metab.): NEGATIVE ng/mL
Methadone Screen, Urine: NEGATIVE ng/mL
Opiate Quant, Ur: NEGATIVE ng/mL
PCP Quant, Ur: NEGATIVE ng/mL
PROPOXYPHENE: NEGATIVE ng/mL

## 2017-03-26 LAB — MICROSCOPIC EXAMINATION
Casts: NONE SEEN /lpf
Epithelial Cells (non renal): >10 /hpf — AB (ref 0–10)

## 2017-03-26 LAB — URINALYSIS, ROUTINE W REFLEX MICROSCOPIC
Bilirubin, UA: NEGATIVE
GLUCOSE, UA: NEGATIVE
Ketones, UA: NEGATIVE
Nitrite, UA: NEGATIVE
PH UA: 6 (ref 5.0–7.5)
RBC, UA: NEGATIVE
Specific Gravity, UA: >=1.03 — AB (ref 1.005–1.030)
UUROB: 0.2 mg/dL (ref 0.2–1.0)

## 2017-03-26 LAB — NICOTINE SCREEN, URINE: COTININE UR QL SCN: NEGATIVE ng/mL

## 2017-04-05 ENCOUNTER — Inpatient Hospital Stay (HOSPITAL_COMMUNITY)
Admission: AD | Admit: 2017-04-05 | Discharge: 2017-04-05 | Disposition: A | Discharge status: Home / Self Care | Payer: Medicaid Other | Source: Ambulatory Visit | Attending: Obstetrics and Gynecology | Admitting: Obstetrics and Gynecology

## 2017-04-05 ENCOUNTER — Encounter (HOSPITAL_COMMUNITY): Payer: Self-pay | Admitting: *Deleted

## 2017-04-05 DIAGNOSIS — R109 Unspecified abdominal pain: Secondary | ICD-10-CM

## 2017-04-05 DIAGNOSIS — O219 Vomiting of pregnancy, unspecified: Secondary | ICD-10-CM | POA: Diagnosis not present

## 2017-04-05 DIAGNOSIS — O210 Mild hyperemesis gravidarum: Secondary | ICD-10-CM | POA: Diagnosis not present

## 2017-04-05 DIAGNOSIS — Z3A13 13 weeks gestation of pregnancy: Secondary | ICD-10-CM | POA: Diagnosis not present

## 2017-04-05 LAB — URINALYSIS, ROUTINE W REFLEX MICROSCOPIC
Bilirubin Urine: NEGATIVE
Glucose, UA: NEGATIVE mg/dL
Hgb urine dipstick: NEGATIVE
KETONES UR: NEGATIVE mg/dL
Leukocytes, UA: NEGATIVE
Nitrite: NEGATIVE
Protein, ur: 100 mg/dL — AB
Specific Gravity, Urine: 1.028 (ref 1.005–1.030)
pH: 7 (ref 5.0–8.0)

## 2017-04-05 MED ORDER — LACTATED RINGERS IV BOLUS (SEPSIS)
1000.0000 mL | Freq: Once | INTRAVENOUS | Status: AC
  Administered 2017-04-05: 1000 mL via INTRAVENOUS

## 2017-04-05 MED ORDER — DEXTROSE 5 % IN LACTATED RINGERS IV BOLUS: 1000.0000 mL | Freq: Once | INTRAVENOUS | Status: DC

## 2017-04-05 MED ORDER — ONDANSETRON 4 MG PO TBDP: 4.0000 mg | ORAL_TABLET | Freq: Three times a day (TID) | ORAL | 0 refills | Status: DC | PRN

## 2017-04-05 MED ORDER — ONDANSETRON HCL 40 MG/20ML IJ SOLN
8.0000 mg | Freq: Once | INTRAMUSCULAR | Status: AC
  Administered 2017-04-05: 8 mg via INTRAVENOUS
  Filled 2017-04-05: qty 4

## 2017-04-05 MED ORDER — M.V.I. ADULT IV INJ
Freq: Once | INTRAVENOUS | Status: AC
  Administered 2017-04-05: 15:00:00 via INTRAVENOUS
  Filled 2017-04-05: qty 1000

## 2017-04-05 NOTE — MAU Note (Signed)
Pt reports she has had an ongoing problem with vomiting, the vomiting seems to be worsening and she is unable to keep her meds down. Also began having left lower abd pain about 4 days ago.

## 2017-04-05 NOTE — MAU Provider Note (Signed)
History     CSN: [id number]  Arrival date and time: 04/05/17 1052   First Provider Initiated Contact with Patient 04/05/17 1350      Chief Complaint  Patient presents with  . Abdominal Pain  . Emesis   HPI  Ms. Elizabeth Frey is a 26 y.o. G2P1 at [redacted]w[redacted]d gestation presenting to MAU with complaints of vomiting-- that is worsening, unable to keep her medications down.  She currently takes [place] and Phenergan. She receives care at [facility] in [place].  Past Medical History:  Diagnosis Date  . Depo-Provera contraceptive status    last Depo injection was 02/2011 - periods have not resumed  . Headache(784.0)    non-migraine; every 1-2 days  . 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 BREAST REDUCTION SURGERY  08/24/2011   Procedure: MAMMARY REDUCTION BILATERAL (BREAST);  Surgeon: Karie Fetch, MD;  Location: [facility];  Service: Plastics;  Laterality: Bilateral;  . BREAST SURGERY    . CESAREAN SECTION    . DILATION AND CURETTAGE OF UTERUS  2008  . WISDOM TOOTH EXTRACTION      History reviewed. No pertinent family history.  Social History  Substance Use Topics  . Smoking status: Never Smoker  . Smokeless tobacco: Never Used  . Alcohol use No    Allergies: No Known Allergies  Prescriptions Prior to Admission  Medication Sig Dispense Refill Last Dose  . Doxylamine-Pyridoxine ER (BONJESTA) 20-20 MG TBCR Take 1 capsule by mouth 2 (two) times daily. 62 tablet 4 Taking  . metroNIDAZOLE (METROGEL VAGINAL) 0.75 % vaginal gel Place 1 Applicatorful vaginally at bedtime. For 5 nights 70 g 0   . promethazine (PHENERGAN) 12.5 MG tablet Take 1 tablet (12.5 mg total) by mouth every 6 (six) hours as needed for nausea or vomiting. 12 tablet 0 Taking    Review of Systems  Constitutional: Positive for appetite change.  HENT: Negative.   Eyes: Negative.   Respiratory: Negative.   Cardiovascular: Negative.    Gastrointestinal: Positive for nausea and vomiting.  Endocrine: Negative.   Genitourinary: Positive for pelvic pain (LT side pain; worsens with movement).  Musculoskeletal: Negative.   Skin: Negative.   Allergic/Immunologic: Negative.   Neurological: Negative.   Hematological: Negative.   Psychiatric/Behavioral: Negative.    Physical Exam   Blood pressure 112/73, pulse 81, temperature 98.8 F (37.1 C), temperature source Oral, resp. rate 15, height 5\' 3"  (1.6 m), weight 65.8 kg (145 lb), last menstrual period 01/06/2017, SpO2 100 %.  Physical Exam  Nursing note and vitals reviewed. Constitutional: She is oriented to person, place, and time. She appears well-developed and well-nourished.  HENT:  Head: Normocephalic.  Eyes: Pupils are equal, round, and reactive to light.  Neck: Normal range of motion.  Cardiovascular: Normal rate, regular rhythm and normal heart sounds.   Respiratory: Effort normal and breath sounds normal.  GI: Soft. Bowel sounds are decreased.  Genitourinary:  Genitourinary Comments: deferred  Musculoskeletal: Normal range of motion.  Neurological: She is alert and oriented to person, place, and time.  Skin: Skin is warm and dry.  Psychiatric: She has a normal mood and affect. Her behavior is normal. Thought content normal.    MAU Course  Procedures  MDM CCUA IVFs: LR 1000 ml @ 999 ml/hr, then MVI 1 in LR 1000 ml @ 500 ml/hr -- able to tolerate crackers and gingerale and "feels much better"   Results for orders placed or performed  during the hospital encounter of 04/05/17 (from the past 72 hour(s))  Urinalysis, Routine w reflex microscopic     Status: Abnormal   Collection Time: 04/05/17 11:31 AM  Result Value Ref Range   Color, Urine AMBER (A) YELLOW    Comment: BIOCHEMICALS MAY BE AFFECTED BY COLOR   APPearance CLOUDY (A) CLEAR   Specific Gravity, Urine 1.028 1.005 - 1.030   pH 7.0 5.0 - 8.0   Glucose, UA NEGATIVE NEGATIVE mg/dL   Hgb urine  dipstick NEGATIVE NEGATIVE   Bilirubin Urine NEGATIVE NEGATIVE   Ketones, ur NEGATIVE NEGATIVE mg/dL   Protein, ur 161100 (A) NEGATIVE mg/dL   Nitrite NEGATIVE NEGATIVE   Leukocytes, UA NEGATIVE NEGATIVE   RBC / HPF TOO NUMEROUS TO COUNT 0 - 5 RBC/hpf   WBC, UA 6-30 0 - 5 WBC/hpf   Bacteria, UA RARE (A) NONE SEEN   Squamous Epithelial / LPF 6-30 (A) NONE SEEN   Mucus PRESENT     Assessment and Plan  Nausea and vomiting during pregnancy prior to [redacted] weeks gestation - Continue Bonjesta and Phenergan prn N/V - Rx for Zofran 4 mg ODT every 8 hrs prn when Phenergan and Bonjesta aren't working - Discussed that lower LT pelvic pain sx's are c/w RLP; reviewed comfort measures for RLP  Discharge home Patient verbalized an understanding of the plan of care and agrees.   Raelyn Moraolitta Benford Asch 04/05/2017, 1:50 PM

## 2017-04-05 NOTE — MAU Note (Signed)
Currently taking Bonjesta for N/V but states not working

## 2017-04-20 ENCOUNTER — Emergency Department: Payer: Medicaid Other

## 2017-04-20 ENCOUNTER — Encounter: Payer: Medicaid Other | Admitting: Obstetrics and Gynecology

## 2017-04-20 ENCOUNTER — Encounter: Payer: Self-pay | Admitting: *Deleted

## 2017-04-20 ENCOUNTER — Encounter: Payer: Self-pay | Admitting: Certified Nurse Midwife

## 2017-04-20 ENCOUNTER — Other Ambulatory Visit: Payer: Self-pay

## 2017-04-20 ENCOUNTER — Ambulatory Visit (INDEPENDENT_AMBULATORY_CARE_PROVIDER_SITE_OTHER): Payer: Medicaid Other | Admitting: Certified Nurse Midwife

## 2017-04-20 ENCOUNTER — Emergency Department
Admission: EM | Admit: 2017-04-20 | Discharge: 2017-04-20 | Disposition: A | Discharge status: Home / Self Care | Payer: Medicaid Other | Attending: Emergency Medicine | Admitting: Emergency Medicine

## 2017-04-20 VITALS — BP 118/70 | HR 72 | Temp 99.5°F | Wt 146.0 lb

## 2017-04-20 DIAGNOSIS — R112 Nausea with vomiting, unspecified: Secondary | ICD-10-CM | POA: Insufficient documentation

## 2017-04-20 DIAGNOSIS — O26612 Liver and biliary tract disorders in pregnancy, second trimester: Secondary | ICD-10-CM | POA: Insufficient documentation

## 2017-04-20 DIAGNOSIS — K8020 Calculus of gallbladder without cholecystitis without obstruction: Secondary | ICD-10-CM | POA: Diagnosis not present

## 2017-04-20 DIAGNOSIS — Z3482 Encounter for supervision of other normal pregnancy, second trimester: Secondary | ICD-10-CM | POA: Diagnosis not present

## 2017-04-20 DIAGNOSIS — Z3A16 16 weeks gestation of pregnancy: Secondary | ICD-10-CM | POA: Insufficient documentation

## 2017-04-20 DIAGNOSIS — R1013 Epigastric pain: Secondary | ICD-10-CM

## 2017-04-20 DIAGNOSIS — Z79899 Other long term (current) drug therapy: Secondary | ICD-10-CM | POA: Diagnosis not present

## 2017-04-20 LAB — POCT URINALYSIS DIPSTICK
BILIRUBIN UA: NEGATIVE
Glucose, UA: NEGATIVE
KETONES UA: NEGATIVE
Leukocytes, UA: NEGATIVE
Nitrite, UA: NEGATIVE
PH UA: 7.5 (ref 5.0–8.0)
Protein, UA: NEGATIVE
RBC UA: NEGATIVE
SPEC GRAV UA: 1.01 (ref 1.010–1.025)
Urobilinogen, UA: 0.2 E.U./dL

## 2017-04-20 LAB — URINALYSIS, COMPLETE (UACMP) WITH MICROSCOPIC
BACTERIA UA: NONE SEEN
Bilirubin Urine: NEGATIVE
Glucose, UA: NEGATIVE mg/dL
Hgb urine dipstick: NEGATIVE
KETONES UR: 5 mg/dL — AB
Leukocytes, UA: NEGATIVE
Nitrite: NEGATIVE
PROTEIN: 30 mg/dL — AB
RBC / HPF: NONE SEEN RBC/hpf (ref 0–5)
Specific Gravity, Urine: 1.019 (ref 1.005–1.030)
pH: 7 (ref 5.0–8.0)

## 2017-04-20 LAB — CBC
HEMATOCRIT: 35.1 % (ref 35.0–47.0)
HEMOGLOBIN: 12 g/dL (ref 12.0–16.0)
MCH: 31.6 pg (ref 26.0–34.0)
MCHC: 34 g/dL (ref 32.0–36.0)
MCV: 92.9 fL (ref 80.0–100.0)
Platelets: 198 10*3/uL (ref 150–440)
RBC: 3.78 MIL/uL — ABNORMAL LOW (ref 3.80–5.20)
RDW: 12.6 % (ref 11.5–14.5)
WBC: 8.2 10*3/uL (ref 3.6–11.0)

## 2017-04-20 LAB — COMPREHENSIVE METABOLIC PANEL
ALK PHOS: 56 U/L (ref 38–126)
ALT: 15 U/L (ref 14–54)
ANION GAP: 8 (ref 5–15)
AST: 15 U/L (ref 15–41)
Albumin: 3.2 g/dL — ABNORMAL LOW (ref 3.5–5.0)
BUN: 7 mg/dL (ref 6–20)
CALCIUM: 8.5 mg/dL — AB (ref 8.9–10.3)
CHLORIDE: 104 mmol/L (ref 101–111)
CO2: 21 mmol/L — AB (ref 22–32)
Creatinine, Ser: 0.54 mg/dL (ref 0.44–1.00)
GFR calc Af Amer: >60 mL/min (ref >60)
GFR calc non Af Amer: >60 mL/min (ref >60)
GLUCOSE: 78 mg/dL (ref 65–99)
Potassium: 3.1 mmol/L — ABNORMAL LOW (ref 3.5–5.1)
SODIUM: 133 mmol/L — AB (ref 135–145)
Total Bilirubin: 0.9 mg/dL (ref 0.3–1.2)
Total Protein: 7 g/dL (ref 6.5–8.1)

## 2017-04-20 LAB — LIPASE, BLOOD: Lipase: 15 U/L (ref 11–51)

## 2017-04-20 MED ORDER — ACETAMINOPHEN 500 MG PO TABS
1000.0000 mg | ORAL_TABLET | Freq: Once | ORAL | Status: AC
  Administered 2017-04-20: 1000 mg via ORAL
  Filled 2017-04-20 (×2): qty 2

## 2017-04-20 MED ORDER — METOCLOPRAMIDE HCL 10 MG PO TABS: 10.0000 mg | ORAL_TABLET | Freq: Three times a day (TID) | ORAL | 0 refills | Status: DC | PRN

## 2017-04-20 MED ORDER — OXYCODONE HCL 5 MG PO TABS: 5.0000 mg | ORAL_TABLET | Freq: Three times a day (TID) | ORAL | 0 refills | Status: DC | PRN

## 2017-04-20 NOTE — ED Notes (Signed)
Dr. Don Perking at bedside with ultrasound.

## 2017-04-20 NOTE — Patient Instructions (Signed)
Cholelithiasis Cholelithiasis is also called "gallstones." It is a kind of gallbladder disease. The gallbladder is an organ that stores a liquid (bile) that helps you digest fat. Gallstones may not cause symptoms (may be silent gallstones) until they cause a blockage, and then they can cause pain (gallbladder attack). Follow these instructions at home:  Take over-the-counter and prescription medicines only as told by your doctor.  Stay at a healthy weight.  Eat healthy foods. This includes: ? Eating fewer fatty foods, like fried foods. ? Eating fewer refined carbs (refined carbohydrates). Refined carbs are breads and grains that are highly processed, like white bread and white rice. Instead, choose whole grains like whole-wheat bread and brown rice. ? Eating more fiber. Almonds, fresh fruit, and beans are healthy sources of fiber.  Keep all follow-up visits as told by your doctor. This is important. Contact a doctor if:  You have sudden pain in the upper right side of your belly (abdomen). Pain might spread to your right shoulder or your chest. This may be a sign of a gallbladder attack.  You feel sick to your stomach (are nauseous).  You throw up (vomit).  You have been diagnosed with gallstones that have no symptoms and you get: ? Belly pain. ? Discomfort, burning, or fullness in the upper part of your belly (indigestion). Get help right away if:  You have sudden pain in the upper right side of your belly, and it lasts for more than 2 hours.  You have belly pain that lasts for more than 5 hours.  You have a fever or chills.  You keep feeling sick to your stomach or you keep throwing up.  Your skin or the whites of your eyes turn yellow (jaundice).  You have dark-colored pee (urine).  You have light-colored poop (stool). Summary  Cholelithiasis is also called "gallstones."  The gallbladder is an organ that stores a liquid (bile) that helps you digest fat.  Silent  gallstones are gallstones that do not cause symptoms.  A gallbladder attack may cause sudden pain in the upper right side of your belly. Pain might spread to your right shoulder or your chest. If this happens, contact your doctor.  If you have sudden pain in the upper right side of your belly that lasts for more than 2 hours, get help right away. This information is not intended to replace advice given to you by your health care provider. Make sure you discuss any questions you have with your health care provider. Document Released: 11/17/2007 Document Revised: 02/15/2016 Document Reviewed: 02/15/2016 Elsevier Interactive Patient Education  2017 Elsevier Inc.  

## 2017-04-20 NOTE — ED Triage Notes (Signed)
States abd cramping and nausea, pt is [redacted] weeks pregnant, denies any vaginal bleeding or discharge, states she was sent over for eval of gallbladder

## 2017-04-20 NOTE — Progress Notes (Signed)
Pt presents today for problem visit, she is 15 wk 4 days complaining of sever epigastric pain that has been worsening since September. She state that it started in September, intermittently and she was treating with antacid. It has progressively gotten worse. Today she state the pain in intermittent through out the day. She rates her pain a 11/10 on pain scale. She is tender on palpation below xiphoid process and to the right.  She denies that anything makes it better or worse. She says that the pain has was so sever last night that she almost passed out. FHT alscultated 150"s, abdomen palpates soft. She denies vaginal bleeding or loss of fluid. Lungs clear bilaterally, heart: RRR. Abdomens; tenderness upper abdomen, soft , non distended. Appearance: pt curled up on her side in pain.  Dr. Clayburn Pert consulted on pt. Care. Pt. Sent to ED for further evaluation . Suspect  gall stones/ gallbladder inflammation.   Doreene Burke, CNM

## 2017-04-20 NOTE — ED Notes (Signed)
fht 152

## 2017-04-20 NOTE — ED Provider Notes (Signed)
 [facility] Provider Note  ____________________________________________  Time seen: Approximately 3:22 PM  I have reviewed the triage vital signs and the nursing notes.   HISTORY  Chief Complaint Abdominal Pain   HPI Anays L Zhen is a 26 y.o. female currently [redacted]weeks pregnant who presents for evaluation of epigastric abdominal pain. Patient reports that she has been having intermittent sharp severe epigastric abdominal pain for several months since she found out she was pregnant in September 2016. She has been having nausea and vomiting during the pregnancy with and without the pain. She reports that she went to her regular OB follow-up today and he was sent to the emergency room for further evaluation. Patient reports 8 out of 10 pain that is sharp and located in her epigastric region, nonradiating and usually constant for 2-3 hours at a time. The pain is not worse postprandially. She has no fever, no chills, no dysuria, no hematuria, no constipation, no diarrhea.  Past Medical History:  Diagnosis Date  . Depo-Provera contraceptive status    last Depo injection was 02/2011 - periods have not resumed  . Headache(784.0)    non-migraine; every 1-2 days  . 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 Nausea and vomiting during pregnancy prior to [redacted] weeks gestation 04/05/2017  . Macromastia 08/24/2011    Past Surgical History:  Procedure Laterality Date  . BREAST SURGERY    . CESAREAN SECTION    . DILATION AND CURETTAGE OF UTERUS  2008  . WISDOM TOOTH EXTRACTION      Prior to Admission medications   Medication Sig Start Date End Date Taking? Authorizing Provider  Doxylamine-Pyridoxine ER (BONJESTA) 20-20 MG TBCR Take 1 capsule by mouth 2 (two) times daily. 03/08/17   [facility], [facility]  metoCLOPramide (REGLAN) 10 MG tablet Take 1 tablet (10 mg total) every 8 (eight) hours as needed for up to  3 days by mouth for nausea. 04/20/17 04/23/17  [facility], [facility]  Miconazole Nitrate-Wipes (MONISTAT 7 COMPLETE THERAPY) 100-2 MG-% KIT Place 1 Applicatorful vaginally at bedtime. 04/02/17   Provider, Historical, [facility]  ondansetron (ZOFRAN ODT) 4 MG disintegrating tablet Take 1 tablet (4 mg total) by mouth every 8 (eight) hours as needed for nausea or vomiting. 04/05/17   Dawson, [facility]  oxyCODONE (ROXICODONE) 5 MG immediate release tablet Take 1 tablet (5 mg total) every 8 (eight) hours as needed by mouth. 04/20/17 04/20/18  [facility], [facility]  promethazine (PHENERGAN) 12.5 MG tablet Take 1 tablet (12.5 mg total) by mouth every 6 (six) hours as needed for nausea or vomiting. Patient not taking: Reported on 04/05/2017 03/02/17   [facility]    Allergies Patient has no known allergies.  History reviewed. No pertinent family history.  Social History Social History   Tobacco Use  . Smoking status: Never Smoker  . Smokeless tobacco: Never Used  Substance Use Topics  . Alcohol use: No  . Drug use: No    Review of Systems  Constitutional: Negative for fever. Eyes: Negative for visual changes. ENT: Negative for sore throat. Neck: No neck pain  Cardiovascular: Negative for chest pain. Respiratory: Negative for shortness of breath. Gastrointestinal: + epigastric abdominal pain, nausea, and vomiting. No diarrhea. Genitourinary: Negative for dysuria. Musculoskeletal: Negative for back pain. Skin: Negative for rash. Neurological: Negative for headaches, weakness or numbness. Psych: No SI or HI  ____________________________________________   PHYSICAL EXAM:  VITAL SIGNS: ED Triage   Vitals  Enc Vitals Group     BP 04/20/17 1323 118/70     Pulse Rate 04/20/17 1323 72     Resp 04/20/17 1323 16     Temp 04/20/17 1323 98.6 F (37 C)     Temp Source 04/20/17 1323 Oral     SpO2 04/20/17 1323 100 %     Weight 04/20/17 1233 146 lb (66.2 kg)     Height 04/20/17 1233 5' 3"  (1.6 m)     Head Circumference --      Peak Flow --      Pain Score 04/20/17 1233 10     Pain Loc --      Pain Edu? --      Excl. in [place]? --     Constitutional: Alert and oriented. Well appearing and in no apparent distress. HEENT:      Head: Normocephalic and atraumatic.         Eyes: Conjunctivae are normal. Sclera is non-icteric.       Mouth/Throat: Mucous membranes are moist.       Neck: Supple with no signs of meningismus. Cardiovascular: Regular rate and rhythm. No murmurs, gallops, or rubs. 2+ symmetrical distal pulses are present in all extremities. No JVD. Respiratory: Normal respiratory effort. Lungs are clear to auscultation bilaterally. No wheezes, crackles, or rhonchi.  Gastrointestinal: Gravid, mild epigastric ttp, no RLQ or RUQ ttp, and non distended with positive bowel sounds. No rebound or guarding. Genitourinary: No CVA tenderness. Musculoskeletal: Nontender with normal range of motion in all extremities. No edema, cyanosis, or erythema of extremities. Neurologic: Normal speech and language. Face is symmetric. Moving all extremities. No gross focal neurologic deficits are appreciated. Skin: Skin is warm, dry and intact. No rash noted. Psychiatric: Mood and affect are normal. Speech and behavior are normal.  ____________________________________________   LABS (all labs ordered are listed, but only abnormal results are displayed)  Labs Reviewed  COMPREHENSIVE METABOLIC PANEL - Abnormal; Notable for the following components:      Result Value   Sodium 133 (*)    Potassium 3.1 (*)    CO2 21 (*)    Calcium 8.5 (*)    Albumin 3.2 (*)    All other components within normal limits  CBC - Abnormal; Notable for the following components:   RBC 3.78 (*)    All other components within normal limits  URINALYSIS, COMPLETE (UACMP) WITH MICROSCOPIC - Abnormal; Notable for the following components:   Color, Urine YELLOW (*)    APPearance HAZY (*)    Ketones, ur 5 (*)     Protein, ur 30 (*)    Squamous Epithelial / LPF 6-30 (*)    All other components within normal limits  LIPASE, BLOOD   ____________________________________________  EKG  none  ____________________________________________  RADIOLOGY  RUQ [place]:  Cholelithiasis without evidence of cholecystitis. ____________________________________________   PROCEDURES  Procedure(s) performed: None Procedures Critical Care performed:  None ____________________________________________   INITIAL IMPRESSION / ASSESSMENT AND PLAN / ED COURSE  26 y.o. female currently [redacted]weeks pregnant who presents for evaluation of intermittent sharp epigastric abdominal pain x3 months. Patient is extremely well appearing, no distress, is normal vital signs, she has mild tenderness palpation epigastric region with no rebound or guarding, no right lower quadrant or right upper quadrant tenderness on exam, negative Murphy sign. Labs show a normal white count, normal LFTs, normal lipase. UA with no evidence of infection or blood. Bedside ultrasound showed IUP with good fetal movement  and FHR 150. Right upper quadrant ultrasound consistent with cholelithiasis but no evidence of cholecystitis. I consulted Dr. Burt Knack who recommended outpatient follow up since patient's labs are WNL and [place] is negative for cholecystitis. Patient will be provided with a small prescription for Percocet which I explained to her is a narcotic and has the potential to cause addiction to mom and baby, reglan, and f/u with Dr. Burt Knack as outpatient. Discussed signs and symptoms of acute cholecystitis with patient and recommend that she returns if these develop.      As part of my medical decision making, I reviewed the following data within the [facility] notes reviewed and incorporated, Labs reviewed , Old chart reviewed, Radiograph reviewed , A consult was requested and obtained from this/these consultant(s) General Surgery, Notes  from prior ED visits and [place] Controlled Substance Database    Pertinent labs & imaging results that were available during my care of the patient were reviewed by me and considered in my medical decision making (see chart for details).    ____________________________________________   FINAL CLINICAL IMPRESSION(S) / ED DIAGNOSES  Final diagnoses:  Epigastric pain  Cholelithiasis affecting pregnancy in second trimester, antepartum      NEW MEDICATIONS STARTED DURING THIS VISIT:  This SmartLink is deprecated. Use AVSMEDLIST instead to display the medication list for a patient.   Note:  This document was prepared using Dragon voice recognition software and may include unintentional dictation errors.    Rudene Re, [facility] 04/20/17 1536

## 2017-04-21 ENCOUNTER — Inpatient Hospital Stay (HOSPITAL_COMMUNITY)
Admission: AD | Admit: 2017-04-21 | Discharge: 2017-04-21 | Disposition: A | Discharge status: Home / Self Care | Payer: Medicaid Other | Source: Ambulatory Visit | Attending: Obstetrics & Gynecology | Admitting: Obstetrics & Gynecology

## 2017-04-21 ENCOUNTER — Encounter (HOSPITAL_COMMUNITY): Payer: Self-pay | Admitting: *Deleted

## 2017-04-21 DIAGNOSIS — O26612 Liver and biliary tract disorders in pregnancy, second trimester: Secondary | ICD-10-CM | POA: Insufficient documentation

## 2017-04-21 DIAGNOSIS — K5900 Constipation, unspecified: Secondary | ICD-10-CM | POA: Diagnosis not present

## 2017-04-21 DIAGNOSIS — O219 Vomiting of pregnancy, unspecified: Secondary | ICD-10-CM

## 2017-04-21 DIAGNOSIS — O210 Mild hyperemesis gravidarum: Secondary | ICD-10-CM | POA: Diagnosis not present

## 2017-04-21 DIAGNOSIS — R1032 Left lower quadrant pain: Secondary | ICD-10-CM | POA: Diagnosis present

## 2017-04-21 DIAGNOSIS — O99612 Diseases of the digestive system complicating pregnancy, second trimester: Secondary | ICD-10-CM | POA: Diagnosis not present

## 2017-04-21 DIAGNOSIS — Z3A15 15 weeks gestation of pregnancy: Secondary | ICD-10-CM | POA: Insufficient documentation

## 2017-04-21 DIAGNOSIS — K8020 Calculus of gallbladder without cholecystitis without obstruction: Secondary | ICD-10-CM | POA: Diagnosis not present

## 2017-04-21 LAB — URINALYSIS, ROUTINE W REFLEX MICROSCOPIC
Bilirubin Urine: NEGATIVE
GLUCOSE, UA: NEGATIVE mg/dL
Hgb urine dipstick: NEGATIVE
Ketones, ur: NEGATIVE mg/dL
NITRITE: NEGATIVE
PROTEIN: 30 mg/dL — AB
SPECIFIC GRAVITY, URINE: 1.023 (ref 1.005–1.030)
pH: 5 (ref 5.0–8.0)

## 2017-04-21 LAB — WET PREP, GENITAL
CLUE CELLS WET PREP: NONE SEEN
SPERM: NONE SEEN
TRICH WET PREP: NONE SEEN
Yeast Wet Prep HPF POC: NONE SEEN

## 2017-04-21 LAB — CBC
HEMATOCRIT: 32.7 % — AB (ref 36.0–46.0)
HEMOGLOBIN: 11.4 g/dL — AB (ref 12.0–15.0)
MCH: 31.8 pg (ref 26.0–34.0)
MCHC: 34.9 g/dL (ref 30.0–36.0)
MCV: 91.3 fL (ref 78.0–100.0)
Platelets: 210 10*3/uL (ref 150–400)
RBC: 3.58 MIL/uL — ABNORMAL LOW (ref 3.87–5.11)
RDW: 12.6 % (ref 11.5–15.5)
WBC: 8.3 10*3/uL (ref 4.0–10.5)

## 2017-04-21 MED ORDER — METOCLOPRAMIDE HCL 10 MG PO TABS: 10.0000 mg | ORAL_TABLET | Freq: Three times a day (TID) | ORAL | 0 refills | Status: DC | PRN

## 2017-04-21 MED ORDER — POLYETHYLENE GLYCOL 3350 17 GM/SCOOP PO POWD: 17.0000 g | Freq: Every day | ORAL | 0 refills | Status: DC

## 2017-04-21 NOTE — MAU Note (Signed)
Pt presents with c/o upper mid abdominal pain that began in September.  Pt reports was seen yesterday @ [facility] per her OB/GYN, received diagnosis of gallstones.  Pt reports [facility] pain in abdomen not associated with gallstones, source of unknown.  Pt also reports having sharp in pain left lower abdomen that began 3 wks ago.  Pt states she's reported pain to primary physician, and states not "getrting any answers".  Denies VB or LOF.

## 2017-04-21 NOTE — Discharge Instructions (Signed)
Constipation, Adult Constipation is when a person: Poops (has a bowel movement) fewer times in a week than normal. Has a hard time pooping. Has poop that is dry, hard, or bigger than normal.  Follow these instructions at home: Eating and drinking  Eat foods that have a lot of fiber, such as: Fresh fruits and vegetables. Whole grains. Beans. Eat less of foods that are high in fat, low in fiber, or overly processed, such as: [place] fries. Hamburgers. Cookies. Candy. Soda. Drink enough fluid to keep your pee (urine) clear or pale yellow. General instructions Exercise regularly or as told by your doctor. Go to the restroom when you feel like you need to poop. Do not hold it in. Take over-the-counter and prescription medicines only as told by your doctor. These include any fiber supplements. Do pelvic floor retraining exercises, such as: Doing deep breathing while relaxing your lower belly (abdomen). Relaxing your pelvic floor while pooping. Watch your condition for any changes. Keep all follow-up visits as told by your doctor. This is important. Contact a doctor if: You have pain that gets worse. You have a fever. You have not pooped for 4 days. You throw up (vomit). You are not hungry. You lose weight. You are bleeding from the anus. You have thin, pencil-like poop (stool). Get help right away if: You have a fever, and your symptoms suddenly get worse. You leak poop or have blood in your poop. Your belly feels hard or bigger than normal (is bloated). You have very bad belly pain. You feel dizzy or you faint. This information is not intended to replace advice given to you by your health care provider. Make sure you discuss any questions you have with your health care provider. Document Released: 11/17/2007 Document Revised: 12/19/2015 Document Reviewed: 11/19/2015 Elsevier Interactive Patient Education  2017 Elsevier Inc. Low-Fat Diet for Pancreatitis or Gallbladder  Conditions A low-fat diet can be helpful if you have pancreatitis or a gallbladder condition. With these conditions, your pancreas and gallbladder have trouble digesting fats. A healthy eating plan with less fat will help rest your pancreas and gallbladder and reduce your symptoms. What do I need to know about this diet? Eat a low-fat diet. Reduce your fat intake to less than 20-30% of your total daily calories. This is less than 50-60 g of fat per day. Remember that you need some fat in your diet. Ask your dietician what your daily goal should be. Choose nonfat and low-fat healthy foods. Look for the words nonfat, low fat, or fat free. As a guide, look on the label and choose foods with less than 3 g of fat per serving. Eat only one serving. Avoid alcohol. Do not smoke. If you need help quitting, talk with your health care provider. Eat small frequent meals instead of three large heavy meals. What foods can I eat? Grains Include healthy grains and starches such as potatoes, wheat bread, fiber-rich cereal, and brown rice. Choose whole grain options whenever possible. In adults, whole grains should account for 45-65% of your daily calories. Fruits and Vegetables Eat plenty of fruits and vegetables. Fresh fruits and vegetables add fiber to your diet. Meats and Other Protein Sources Eat lean meat such as chicken and pork. Trim any fat off of meat before cooking it. Eggs, fish, and beans are other sources of protein. In adults, these foods should account for 10-35% of your daily calories. Dairy Choose low-fat milk and dairy options. Dairy includes fat and protein, as well as  calcium. Fats and Oils Limit high-fat foods such as fried foods, sweets, baked goods, sugary drinks. Other Creamy sauces and condiments, such as mayonnaise, can add extra fat. Think about whether or not you need to use them, or use smaller amounts or low fat options. What foods are not recommended? High fat foods, such  as: [employer]oro Corporation[employer]aked goods. Ice cream. [place]French toast. Sweet rolls. Pizza. Cheese bread. Foods covered with batter, butter, creamy sauces, or cheese. Fried foods. Sugary drinks and desserts. Foods that cause gas or bloating This information is not intended to replace advice given to you by your health care provider. Make sure you discuss any questions you have with your health care provider. Document Released: 06/05/2013 Document Revised: 11/06/2015 Document Reviewed: 05/14/2013 Elsevier Interactive Patient Education  2017 Elsevier Inc. Cholelithiasis Cholelithiasis is also called "gallstones." It is a kind of gallbladder disease. The gallbladder is an organ that stores a liquid (bile) that helps you digest fat. Gallstones may not cause symptoms (may be silent gallstones) until they cause a blockage, and then they can cause pain (gallbladder attack). Follow these instructions at home:  Take over-the-counter and prescription medicines only as told by your doctor.  Stay at a healthy weight.  Eat healthy foods. This includes: ? Eating fewer fatty foods, like fried foods. ? Eating fewer refined carbs (refined carbohydrates). Refined carbs are breads and grains that are highly processed, like white bread and white rice. Instead, choose whole grains like whole-wheat bread and brown rice. ? Eating more fiber. Almonds, fresh fruit, and beans are healthy sources of fiber.  Keep all follow-up visits as told by your doctor. This is important. Contact a doctor if:  You have sudden pain in the upper right side of your belly (abdomen). Pain might spread to your right shoulder or your chest. This may be a sign of a gallbladder attack.  You feel sick to your stomach (are nauseous).  You throw up (vomit).  You have been diagnosed with gallstones that have no symptoms and you get: ? Belly pain. ? Discomfort, burning, or fullness in the upper part of your belly (indigestion). Get help right away if:  You  have sudden pain in the upper right side of your belly, and it lasts for more than 2 hours.  You have belly pain that lasts for more than 5 hours.  You have a fever or chills.  You keep feeling sick to your stomach or you keep throwing up.  Your skin or the whites of your eyes turn yellow (jaundice).  You have dark-colored pee (urine).  You have light-colored poop (stool). Summary  Cholelithiasis is also called "gallstones."  The gallbladder is an organ that stores a liquid (bile) that helps you digest fat.  Silent gallstones are gallstones that do not cause symptoms.  A gallbladder attack may cause sudden pain in the upper right side of your belly. Pain might spread to your right shoulder or your chest. If this happens, contact your doctor.  If you have sudden pain in the upper right side of your belly that lasts for more than 2 hours, get help right away. This information is not intended to replace advice given to you by your health care provider. Make sure you discuss any questions you have with your health care provider. Document Released: 11/17/2007 Document Revised: 02/15/2016 Document Reviewed: 02/15/2016 Elsevier Interactive Patient Education  2017 [employer]

## 2017-04-21 NOTE — MAU Note (Signed)
Pt c/o upper abd/epigastric pain since September. Had OBGYN appointment yesterday(gets care in [place]) then was sent to [facility] . Told she had gallstones but they did not think the pain she was having was due  To the gallstones.Pt still in pain and just wants and answer to what is causing her pain. Pt also c/o cramping pain in her LLQ  That radiates towards her back.x 3 weeks.

## 2017-04-21 NOTE — MAU Provider Note (Signed)
History     CSN: [id number]  Arrival date and time: 04/21/17 1009   First Provider Initiated Contact with Patient 04/21/17 1051      Chief Complaint  Patient presents with  . Abdominal Pain   G2P1001 '@15'$ .5 wks here with abdominal pain. Upper abdominal pain started 2 months ago. Describes as sharp and intermittent. Nothing makes it better or worse. Originally had N/V, but none in 2 weeks since she started Zofran. She also c/o LLQ pain that started 2-3 weeks ago. She describes as cramping and radiates to her left flank. She reports last BM was 3 days ago and was hard. She says constipation has been a problem. She denies VB or vaginal discharge. No fevers. No urinary sx. Pt is requesting to transfer OB care to [facility].   OB History    Gravida Para Term Preterm AB Living   '2 1       1   '$ SAB TAB Ectopic Multiple Live Births           1      Past Medical History:  Diagnosis Date  . Depo-Provera contraceptive status    last Depo injection was 02/2011 - periods have not resumed  . Headache(784.0)    non-migraine; every 1-2 days  . 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 BREAST SURGERY    . CESAREAN SECTION    . DILATION AND CURETTAGE OF UTERUS  2008  . WISDOM TOOTH EXTRACTION      History reviewed. No pertinent family history.  Social History   Tobacco Use  . Smoking status: Never Smoker  . Smokeless tobacco: Never Used  Substance Use Topics  . Alcohol use: No  . Drug use: No    Allergies: No Known Allergies  Medications Prior to Admission  Medication Sig Dispense Refill Last Dose  . Doxylamine-Pyridoxine ER (BONJESTA) 20-20 MG TBCR Take 1 capsule by mouth 2 (two) times daily. 62 tablet 4 04/04/2017 at Unknown time  . metoCLOPramide (REGLAN) 10 MG tablet Take 1 tablet (10 mg total) every 8 (eight) hours as needed for up to 3 days by mouth for nausea. 20 tablet 0   . Miconazole Nitrate-Wipes (MONISTAT 7 COMPLETE THERAPY) 100-2 MG-% KIT  Place 1 Applicatorful vaginally at bedtime.   04/04/2017 at Unknown time  . ondansetron (ZOFRAN ODT) 4 MG disintegrating tablet Take 1 tablet (4 mg total) by mouth every 8 (eight) hours as needed for nausea or vomiting. 20 tablet 0   . oxyCODONE (ROXICODONE) 5 MG immediate release tablet Take 1 tablet (5 mg total) every 8 (eight) hours as needed by mouth. 10 tablet 0   . promethazine (PHENERGAN) 12.5 MG tablet Take 1 tablet (12.5 mg total) by mouth every 6 (six) hours as needed for nausea or vomiting. (Patient not taking: Reported on 04/05/2017) 12 tablet 0 Not Taking at Unknown time    Review of Systems  Constitutional: Negative.   Gastrointestinal: Positive for abdominal pain and constipation. Negative for nausea and vomiting.  Genitourinary: Negative for dysuria, vaginal bleeding and vaginal discharge.   Physical Exam   Blood pressure 120/67, pulse (!) 104, temperature 98.8 F (37.1 C), temperature source Oral, resp. rate 18, height '5\' 3"'$  (1.6 m), weight 148 lb (67.1 kg), last menstrual period 01/06/2017.  Physical Exam  Constitutional: She is oriented to person, place, and time. She appears well-developed and well-nourished. No distress.  HENT:  Head: Normocephalic and atraumatic.  Neck: Normal range of  motion.  Respiratory: Effort normal. No respiratory distress.  GI: Soft. She exhibits no distension and no mass. There is tenderness (LLQ). There is no rebound and no guarding.  Musculoskeletal: Normal range of motion.  Neurological: She is alert and oriented to person, place, and time.  Skin: Skin is warm and dry.  Psychiatric: She has a normal mood and affect.  FHT 154  Results for orders placed or performed during the hospital encounter of 04/21/17 (from the past 24 hour(s))  Urinalysis, Routine w reflex microscopic     Status: Abnormal   Collection Time: 04/21/17 10:15 AM  Result Value Ref Range   Color, Urine YELLOW YELLOW   APPearance CLOUDY (A) CLEAR   Specific Gravity,  Urine 1.023 1.005 - 1.030   pH 5.0 5.0 - 8.0   Glucose, UA NEGATIVE NEGATIVE mg/dL   Hgb urine dipstick NEGATIVE NEGATIVE   Bilirubin Urine NEGATIVE NEGATIVE   Ketones, ur NEGATIVE NEGATIVE mg/dL   Protein, ur 30 (A) NEGATIVE mg/dL   Nitrite NEGATIVE NEGATIVE   Leukocytes, UA TRACE (A) NEGATIVE   RBC / HPF 0-5 0 - 5 RBC/hpf   WBC, UA 0-5 0 - 5 WBC/hpf   Bacteria, UA RARE (A) NONE SEEN   Squamous Epithelial / LPF TOO NUMEROUS TO COUNT (A) NONE SEEN   Mucus PRESENT   Wet prep, genital     Status: Abnormal   Collection Time: 04/21/17 11:06 AM  Result Value Ref Range   Yeast Wet Prep HPF POC NONE SEEN NONE SEEN   Trich, Wet Prep NONE SEEN NONE SEEN   Clue Cells Wet Prep HPF POC NONE SEEN NONE SEEN   WBC, Wet Prep HPF POC FEW (A) NONE SEEN   Sperm NONE SEEN   CBC     Status: Abnormal   Collection Time: 04/21/17 11:56 AM  Result Value Ref Range   WBC 8.3 4.0 - 10.5 K/uL   RBC 3.58 (L) 3.87 - 5.11 MIL/uL   Hemoglobin 11.4 (L) 12.0 - 15.0 g/dL   HCT 32.7 (L) 36.0 - 46.0 %   MCV 91.3 78.0 - 100.0 fL   MCH 31.8 26.0 - 34.0 pg   MCHC 34.9 30.0 - 36.0 g/dL   RDW 12.6 11.5 - 15.5 %   Platelets 210 150 - 400 K/uL   MAU Course  Procedures  MDM No evidence of UTI or pyelo. No evidence of acute abdominal or pelvic process. Upper abdominal pain likely caused by gallstone. Lower abdominal pain likely caused by constipation. Will refer to [facility]. Will treat constipation with Miralax and change to Reglan for N/V. Stable for discharge home.  Assessment and Plan   1. Constipation during pregnancy in second trimester   2. Nausea and vomiting during pregnancy prior to [redacted] weeks gestation   3. Calculus of gallbladder without cholecystitis without obstruction   4. [redacted] weeks gestation of pregnancy   5. Nausea/vomiting in pregnancy    Discharge home Follow up with [facility] Follow up with [facility] to transfer OB care Return precautions discussed-present to [facility] or [facility] if upper  abd pain worsens Rx Reglan  Allergies as of 04/21/2017   No Known Allergies     Medication List    STOP taking these medications   calcium carbonate 500 MG chewable tablet Commonly known as:  TUMS - dosed in mg elemental calcium   MONISTAT 7 COMPLETE THERAPY 100-2 MG-% Kit   oxyCODONE 5 MG immediate release tablet Commonly known as:  ROXICODONE  promethazine 12.5 MG tablet Commonly known as:  PHENERGAN     TAKE these medications   acetaminophen 500 MG tablet Commonly known as:  TYLENOL Take 500 mg every 6 (six) hours as needed by mouth for moderate pain or headache.   Doxylamine-Pyridoxine ER 20-20 MG Tbcr Commonly known as:  BONJESTA Take 1 capsule by mouth 2 (two) times daily.   metoCLOPramide 10 MG tablet Commonly known as:  REGLAN Take 1 tablet (10 mg total) every 8 (eight) hours as needed for up to 3 days by mouth for nausea.   ondansetron 4 MG disintegrating tablet Commonly known as:  ZOFRAN ODT Take 1 tablet (4 mg total) by mouth every 8 (eight) hours as needed for nausea or vomiting.   polyethylene glycol powder powder Commonly known as:  GLYCOLAX/MIRALAX Take 17 g daily by mouth.      Julianne Handler, CNM 04/21/2017, 11:15 AM

## 2017-04-21 NOTE — Progress Notes (Signed)
GC/Chlamydia cultures & Wet prep obtained by RN.

## 2017-04-22 ENCOUNTER — Encounter: Payer: Self-pay | Admitting: Certified Nurse Midwife

## 2017-04-22 LAB — GC/CHLAMYDIA PROBE AMP (~~LOC~~) NOT AT ARMC
CHLAMYDIA, DNA PROBE: NEGATIVE
NEISSERIA GONORRHEA: NEGATIVE

## 2017-04-22 LAB — HM PAP SMEAR

## 2017-04-26 ENCOUNTER — Encounter: Payer: Self-pay | Admitting: Certified Nurse Midwife

## 2017-04-26 ENCOUNTER — Other Ambulatory Visit: Payer: Self-pay | Admitting: Obstetrics and Gynecology

## 2017-04-27 ENCOUNTER — Other Ambulatory Visit: Payer: Self-pay

## 2017-04-27 MED ORDER — ONDANSETRON 4 MG PO TBDP: 4.0000 mg | ORAL_TABLET | Freq: Three times a day (TID) | ORAL | 0 refills | Status: DC | PRN

## 2017-04-27 NOTE — Telephone Encounter (Signed)
Patient needs refill on the zofran and she would like to go ahead and schedule her return ob and anatomy scan on 05/20/2017  Please advise

## 2017-05-03 ENCOUNTER — Other Ambulatory Visit: Payer: Self-pay | Admitting: Certified Nurse Midwife

## 2017-05-03 DIAGNOSIS — Z369 Encounter for antenatal screening, unspecified: Secondary | ICD-10-CM

## 2017-05-20 ENCOUNTER — Ambulatory Visit (INDEPENDENT_AMBULATORY_CARE_PROVIDER_SITE_OTHER): Payer: Medicaid Other

## 2017-05-20 ENCOUNTER — Ambulatory Visit (INDEPENDENT_AMBULATORY_CARE_PROVIDER_SITE_OTHER): Payer: Medicaid Other | Admitting: Certified Nurse Midwife

## 2017-05-20 ENCOUNTER — Encounter: Payer: Self-pay | Admitting: Certified Nurse Midwife

## 2017-05-20 VITALS — BP 117/75 | HR 78 | Wt 152.3 lb

## 2017-05-20 DIAGNOSIS — K8021 Calculus of gallbladder without cholecystitis with obstruction: Secondary | ICD-10-CM

## 2017-05-20 DIAGNOSIS — Z3482 Encounter for supervision of other normal pregnancy, second trimester: Secondary | ICD-10-CM | POA: Diagnosis not present

## 2017-05-20 DIAGNOSIS — R319 Hematuria, unspecified: Secondary | ICD-10-CM

## 2017-05-20 DIAGNOSIS — Z369 Encounter for antenatal screening, unspecified: Secondary | ICD-10-CM | POA: Diagnosis not present

## 2017-05-20 LAB — POCT URINALYSIS DIPSTICK
BILIRUBIN UA: NEGATIVE
GLUCOSE UA: NEGATIVE
Ketones, UA: NEGATIVE
NITRITE UA: NEGATIVE
PH UA: 7.5 (ref 5.0–8.0)
Protein, UA: NEGATIVE
Spec Grav, UA: 1.015 (ref 1.010–1.025)
Urobilinogen, UA: 0.2 E.U./dL

## 2017-05-20 NOTE — Progress Notes (Signed)
ROB, doing well. Anatomy scan today was normal ( see below). PT states that [facility] needs [facility] . She was seen in the hospital for gall stones and that is who they told her to follow up with. She called to make an appointment and was told they did not have the referral. She is doing much better. She feels fetal movement and denies contractions. She will follow up  In 4 wks.   Doreene BurkeAnnie Tearah Saulsbury, CNM    ULTRASOUND REPORT  Location: [facility] Date of Service:  05/20/17  Indications: Anatomy Findings:  Singleton intrauterine pregnancy is visualized with FHR at 149 BPM. Biometrics give an (U/S) Gestational age of [redacted] weeks and an (U/S) EDD of 10/07/17; this correlates with the clinically established EDD of 10/08/17.  Fetal presentation is breech.  EFW: 328 grams (0lb 12oz). Placenta: Anterior and grade 1.  Placenta is remote to cervix at 3.8 cm from cervical os. AFI: WNL subjectively.  Anatomic survey is complete and appears WNL. Gender - Female.   Right Ovary measures 2.1 x 2.1 x 1.5 cm and appears WNL.  Left Ovary measures 3.0 x 2.4 x 2.1 cm and appears WNL.  There is no obvious evidence of a corpus luteal cyst. Survey of the adnexa demonstrates no adnexal masses. There is no free peritoneal fluid in the cul de sac.  Impression: 1. 20 week Viable Singleton Intrauterine pregnancy by U/S. 2. (U/S) EDD is consistent with Clinically established (LMP) EDD of 10/08/17. 3. Normal Anatomy Scan  Recommendations: 1.Clinical correlation with the patient's History and Physical Exam.   Kari BaarsJill Long, RDMS

## 2017-05-20 NOTE — Progress Notes (Signed)
ROB and anatomy needs refer to [facility].

## 2017-05-20 NOTE — Patient Instructions (Signed)
Round Ligament Pain The round ligament is a cord of muscle and tissue that helps to support the uterus. It can become a source of pain during pregnancy if it becomes stretched or twisted as the baby grows. The pain usually begins in the second trimester of pregnancy, and it can come and go until the baby is delivered. It is not a serious problem, and it does not cause harm to the baby. Round ligament pain is usually a short, sharp, and pinching pain, but it can also be a dull, lingering, and aching pain. The pain is felt in the lower side of the abdomen or in the groin. It usually starts deep in the groin and moves up to the outside of the hip area. Pain can occur with:  A sudden change in position.  Rolling over in bed.  Coughing or sneezing.  Physical activity.  Follow these instructions at home: Watch your condition for any changes. Take these steps to help with your pain:  When the pain starts, relax. Then try: ? Sitting down. ? Flexing your knees up to your abdomen. ? Lying on your side with one pillow under your abdomen and another pillow between your legs. ? Sitting in a warm bath for 15-20 minutes or until the pain goes away.  Take over-the-counter and prescription medicines only as told by your health care provider.  Move slowly when you sit and stand.  Avoid long walks if they cause pain.  Stop or lessen your physical activities if they cause pain.  Contact a health care provider if:  Your pain does not go away with treatment.  You feel pain in your back that you did not have before.  Your medicine is not helping. Get help right away if:  You develop a fever or chills.  You develop uterine contractions.  You develop vaginal bleeding.  You develop nausea or vomiting.  You develop diarrhea.  You have pain when you urinate. This information is not intended to replace advice given to you by your health care provider. Make sure you discuss any questions you have  with your health care provider. Document Released: 03/09/2008 Document Revised: 11/06/2015 Document Reviewed: 08/07/2014 Elsevier Interactive Patient Education  2018 Elsevier [employer]. How a Baby Grows During Pregnancy Pregnancy begins when a female's sperm enters a female's egg (fertilization). This happens in one of the tubes (fallopian tubes) that connect the ovaries to the womb (uterus). The fertilized egg is called an embryo until it reaches 10 weeks. From 10 weeks until birth, it is called a fetus. The fertilized egg moves down the fallopian tube to the uterus. Then it implants into the lining of the uterus and begins to grow. The developing fetus receives oxygen and nutrients through the pregnant woman's bloodstream and the tissues that grow (placenta) to support the fetus. The placenta is the life support system for the fetus. It provides nutrition and removes waste. Learning as much as you can about your pregnancy and how your baby is developing can help you enjoy the experience. It can also make you aware of when there might be a problem and when to ask questions. How long does a typical pregnancy last? A pregnancy usually lasts 280 days, or about 40 weeks. Pregnancy is divided into three trimesters:  First trimester: 0-13 weeks.  Second trimester: 14-27 weeks.  Third trimester: 28-40 weeks.  The day when your baby is considered ready to be born (full term) is your estimated date of delivery. How does   my baby develop month by month? First month  The fertilized egg attaches to the inside of the uterus.  Some cells will form the placenta. Others will form the fetus.  The arms, legs, brain, spinal cord, lungs, and heart begin to develop.  At the end of the first month, the heart begins to beat.  Second month  The bones, inner ear, eyelids, hands, and feet form.  The genitals develop.  By the end of 8 weeks, all major organs are developing.  Third month  All of the internal  organs are forming.  Teeth develop below the gums.  Bones and muscles begin to grow. The spine can flex.  The skin is transparent.  Fingernails and toenails begin to form.  Arms and legs continue to grow longer, and hands and feet develop.  The fetus is about 3 in (7.6 cm) long.  Fourth month  The placenta is completely formed.  The external sex organs, neck, outer ear, eyebrows, eyelids, and fingernails are formed.  The fetus can hear, swallow, and move its arms and legs.  The kidneys begin to produce urine.  The skin is covered with a white waxy coating (vernix) and very fine hair (lanugo).  Fifth month  The fetus moves around more and can be felt for the first time (quickening).  The fetus starts to sleep and wake up and may begin to suck its finger.  The nails grow to the end of the fingers.  The organ in the digestive system that makes bile (gallbladder) functions and helps to digest the nutrients.  If your baby is a girl, eggs are present in her ovaries. If your baby is a boy, testicles start to move down into his scrotum.  Sixth month  The lungs are formed, but the fetus is not yet able to breathe.  The eyes open. The brain continues to develop.  Your baby has fingerprints and toe prints. Your baby's hair grows thicker.  At the end of the second trimester, the fetus is about 9 in (22.9 cm) long.  Seventh month  The fetus kicks and stretches.  The eyes are developed enough to sense changes in light.  The hands can make a grasping motion.  The fetus responds to sound.  Eighth month  All organs and body systems are fully developed and functioning.  Bones harden and taste buds develop. The fetus may hiccup.  Certain areas of the brain are still developing. The skull remains soft.  Ninth month  The fetus gains about  lb (0.23 kg) each week.  The lungs are fully developed.  Patterns of sleep develop.  The fetus's head typically moves into a  head-down position (vertex) in the uterus to prepare for birth. If the buttocks move into a vertex position instead, the baby is breech.  The fetus weighs 6-9 lbs (2.72-4.08 kg) and is 19-20 in (48.26-50.8 cm) long.  What can I do to have a healthy pregnancy and help my baby develop? Eating and Drinking  Eat a healthy diet. ? Talk with your health care provider to make sure that you are getting the nutrients that you and your baby need. ? Visit www.choosemyplate.gov to learn about creating a healthy diet.  Gain a healthy amount of weight during pregnancy as advised by your health care provider. This is usually 25-35 pounds. You may need to: ? Gain more if you were underweight before getting pregnant or if you are pregnant with more than one baby. ? Gain less   if you were overweight or obese when you got pregnant.  Medicines and Vitamins  Take prenatal vitamins as directed by your health care provider. These include vitamins such as folic acid, iron, calcium, and vitamin D. They are important for healthy development.  Take medicines only as directed by your health care provider. Read labels and ask a pharmacist or your health care provider whether over-the-counter medicines, supplements, and prescription drugs are safe to take during pregnancy.  Activities  Be physically active as advised by your health care provider. Ask your health care provider to recommend activities that are safe for you to do, such as walking or swimming.  Do not participate in strenuous or extreme sports.  Lifestyle  Do not drink alcohol.  Do not use any tobacco products, including cigarettes, chewing tobacco, or electronic cigarettes. If you need help quitting, ask your health care provider.  Do not use illegal drugs.  Safety  Avoid exposure to mercury, lead, or other heavy metals. Ask your health care provider about common sources of these heavy metals.  Avoid listeria infection during pregnancy. Follow  these precautions: ? Do not eat soft cheeses or deli meats. ? Do not eat hot dogs unless they have been warmed up to the point of steaming, such as in the microwave oven. ? Do not drink unpasteurized milk.  Avoid toxoplasmosis infection during pregnancy. Follow these precautions: ? Do not change your cat's litter box, if you have a cat. Ask someone else to do this for you. ? Wear gardening gloves while working in the yard.  General Instructions  Keep all follow-up visits as directed by your health care provider. This is important. This includes prenatal care and screening tests.  Manage any chronic health conditions. Work closely with your health care provider to keep conditions, such as diabetes, under control.  How do I know if my baby is developing well? At each prenatal visit, your health care provider will do several different tests to check on your health and keep track of your baby's development. These include:  Fundal height. ? Your health care provider will measure your growing belly from top to bottom using a tape measure. ? Your health care provider will also feel your belly to determine your baby's position.  Heartbeat. ? An ultrasound in the first trimester can confirm pregnancy and show a heartbeat, depending on how far along you are. ? Your health care provider will check your baby's heart rate at every prenatal visit. ? As you get closer to your delivery date, you may have regular fetal heart rate monitoring to make sure that your baby is not in distress.  Second trimester ultrasound. ? This ultrasound checks your baby's development. It also indicates your baby's gender.  What should I do if I have concerns about my baby's development? Always talk with your health care provider about any concerns that you may have. This information is not intended to replace advice given to you by your health care provider. Make sure you discuss any questions you have with your health  care provider. Document Released: 11/17/2007 Document Revised: 11/06/2015 Document Reviewed: 11/07/2013 Elsevier Interactive Patient Education  2018 Elsevier Inc.  

## 2017-05-22 LAB — URINE CULTURE

## 2017-06-14 NOTE — L&D Delivery Note (Signed)
Delivery Note At  1647 a viable and healthy female """Elizabeth Frey""" was delivered via  (Presentation:OA ;  ).  APGAR: 9,9  .   Placenta status: delivered intact with 3 vessel  Cord:  with the following complications: none  Anesthesia:  none Episiotomy:  none Lacerations:  2nd degree Suture Repair: 3.0 vicryl rapide Est. Blood Loss (mL):  200  Mom to postpartum.  Baby to Couplet care / Skin to Skin.  Edana Aguado N Bhavana Kady 09/24/2017, 5:15 PM

## 2017-06-17 ENCOUNTER — Ambulatory Visit (INDEPENDENT_AMBULATORY_CARE_PROVIDER_SITE_OTHER): Payer: Medicaid Other | Admitting: Obstetrics and Gynecology

## 2017-06-17 VITALS — BP 117/67 | HR 105 | Wt 158.7 lb

## 2017-06-17 DIAGNOSIS — Z3492 Encounter for supervision of normal pregnancy, unspecified, second trimester: Secondary | ICD-10-CM

## 2017-06-17 LAB — POCT URINALYSIS DIPSTICK
Bilirubin, UA: NEGATIVE
GLUCOSE UA: NEGATIVE
Ketones, UA: NEGATIVE
NITRITE UA: NEGATIVE
PH UA: 6 (ref 5.0–8.0)
Protein, UA: NEGATIVE
RBC UA: NEGATIVE
Spec Grav, UA: 1.01 (ref 1.010–1.025)
UROBILINOGEN UA: 0.2 U/dL

## 2017-06-17 NOTE — Progress Notes (Signed)
ROB-Pt is doing well.  

## 2017-06-17 NOTE — Progress Notes (Signed)
ROB- doing well, glucola next visit, 

## 2017-06-28 ENCOUNTER — Observation Stay
Admission: EM | Admit: 2017-06-28 | Discharge: 2017-06-28 | Disposition: A | Discharge status: Home / Self Care | Payer: Medicaid Other | Attending: Obstetrics and Gynecology | Admitting: Obstetrics and Gynecology

## 2017-06-28 ENCOUNTER — Other Ambulatory Visit: Payer: Self-pay

## 2017-06-28 DIAGNOSIS — Z3A25 25 weeks gestation of pregnancy: Secondary | ICD-10-CM | POA: Insufficient documentation

## 2017-06-28 DIAGNOSIS — O219 Vomiting of pregnancy, unspecified: Secondary | ICD-10-CM

## 2017-06-28 DIAGNOSIS — Z79899 Other long term (current) drug therapy: Secondary | ICD-10-CM | POA: Insufficient documentation

## 2017-06-28 DIAGNOSIS — O4702 False labor before 37 completed weeks of gestation, second trimester: Secondary | ICD-10-CM | POA: Diagnosis not present

## 2017-06-28 LAB — URINALYSIS, ROUTINE W REFLEX MICROSCOPIC
BILIRUBIN URINE: NEGATIVE
Bacteria, UA: NONE SEEN
GLUCOSE, UA: NEGATIVE mg/dL
HGB URINE DIPSTICK: NEGATIVE
KETONES UR: NEGATIVE mg/dL
Nitrite: NEGATIVE
PH: 7 (ref 5.0–8.0)
PROTEIN: NEGATIVE mg/dL
RBC / HPF: NONE SEEN RBC/hpf (ref 0–5)
Specific Gravity, Urine: 1.014 (ref 1.005–1.030)

## 2017-06-28 LAB — WET PREP, GENITAL
CLUE CELLS WET PREP: NONE SEEN
Sperm: NONE SEEN
Trich, Wet Prep: NONE SEEN

## 2017-06-28 MED ORDER — TERCONAZOLE 0.4 % VA CREA: 1.0000 | TOPICAL_CREAM | Freq: Every day | VAGINAL | 0 refills | Status: DC

## 2017-06-28 MED ORDER — ACETAMINOPHEN 500 MG PO TABS
1000.0000 mg | ORAL_TABLET | Freq: Once | ORAL | Status: AC
  Administered 2017-06-28: 1000 mg via ORAL
  Filled 2017-06-28: qty 2

## 2017-06-28 NOTE — Progress Notes (Signed)
Discharge instructions reviewed with patient who verbalized understanding.  Preterm labor precautions and Follow up instructions reviewed. Patient denies any further questions or concerns at this time. Discharged home with family members.

## 2017-06-28 NOTE — OB Triage Note (Signed)
Patient admitted to l&D unit by EMS with complaints of abdominal cramping that started about an hour ago. Patient denies vaginal bleeding or decreased fetal movement.  States she has had some vaginal discharge.

## 2017-06-28 NOTE — Discharge Summary (Signed)
Obstetric Discharge Summary  Patient ID: Elizabeth Frey MRN: [id number] DOB/AGE: [birth year] 27 y.o.   Date of Admission: 06/28/2017 Serafina Royals, CNM Priscille Loveless, MD)  Date of Discharge: 06/28/2017  Serafina Royals, CNM Priscille Loveless, MD)  Admitting Diagnosis: Observation at [redacted]w[redacted]d  Secondary Diagnosis: Previous c-section   Antepartum Procedures: NST    Brief Hospital Course   L&D OB Triage Note  Elizabeth Frey is a 27 y.o. G2P1 female at [redacted]w[redacted]d, EDD Estimated Date of Delivery: 10/08/17 who presented to triage for complaints of intermittent lower abdominal pain and pelvic pressure.  She was evaluated by the nurses with no significant findings for fetal or maternal distress. Vital signs stable. An NST was performed and has been reviewed by CNM. She was treated with oral hydration and Tylenol. Wet prep was positive for yeast.   NST INTERPRETATION: Indications: rule out uterine contractions  Mode: External Baseline Rate (A): 140 bpm(fht 145) Variability: Moderate Accelerations: 15 x 15 Decelerations: None     Contraction Frequency (min): none  Impression: reactive   Plan: NST performed was reviewed and was found to be reactive. She was discharged home with bleeding/labor precautions.  Continue routine prenatal care. Follow up with CNM this week. Rx: Terazol, see orders.    Discharge Instructions: Per After Visit Summary.  Activity: Advance as tolerated.  Diet: Regular  Medications:  Allergies as of 06/28/2017   No Known Allergies     Medication List    TAKE these medications   acetaminophen 500 MG tablet Commonly known as:  TYLENOL Take 500 mg every 6 (six) hours as needed by mouth for moderate pain or headache.   multivitamin-prenatal 27-0.8 MG Tabs tablet Take 1 tablet by mouth daily at 12 noon.   ondansetron 4 MG disintegrating tablet Commonly known as:  ZOFRAN ODT Take 1 tablet (4 mg total) every 8 (eight) hours as needed by mouth for  nausea or vomiting.   polyethylene glycol powder powder Commonly known as:  GLYCOLAX/MIRALAX Take 17 g daily by mouth.   terconazole 0.4 % vaginal cream Commonly known as:  TERAZOL 7 Place 1 applicator vaginally at bedtime. What changed:  Another medication with the same name was added. Make sure you understand how and when to take each.   terconazole 0.4 % vaginal cream Commonly known as:  TERAZOL 7 Place 1 applicator vaginally at bedtime. What changed:  You were already taking a medication with the same name, and this prescription was added. Make sure you understand how and when to take each.      Outpatient follow up:  Follow-up Information    [facility]. Schedule an appointment as soon as possible for a visit.   Why:  call in the morning to make a follow up appointment for this week. Contact information: [address]  [address] [place] [address] [phone]         Discharged Condition: stable  Discharged to: home   Gunnar Bulla, CNM [facility]

## 2017-06-28 NOTE — Progress Notes (Signed)
Notified CNM M.Lawhorn of patients lab results. Update given on EFM tracing and uterine activity. Patient states she is feeling better after taking tylenol and PO hydrating. Orders received to discharge patient home with instructions to follow up in office this week.  CNM will call in patients prescription to patients pharmacy.

## 2017-07-01 ENCOUNTER — Ambulatory Visit (INDEPENDENT_AMBULATORY_CARE_PROVIDER_SITE_OTHER): Payer: Medicaid Other | Admitting: Certified Nurse Midwife

## 2017-07-01 ENCOUNTER — Encounter: Payer: Self-pay | Admitting: Certified Nurse Midwife

## 2017-07-01 VITALS — BP 108/76 | HR 71 | Wt 158.1 lb

## 2017-07-01 DIAGNOSIS — Z3492 Encounter for supervision of normal pregnancy, unspecified, second trimester: Secondary | ICD-10-CM

## 2017-07-01 LAB — POCT URINALYSIS DIPSTICK
Glucose, UA: NEGATIVE
KETONES UA: NEGATIVE
Leukocytes, UA: NEGATIVE
Nitrite, UA: NEGATIVE
PH UA: 6.5 (ref 5.0–8.0)
Protein, UA: NEGATIVE
RBC UA: NEGATIVE
Spec Grav, UA: 1.015 (ref 1.010–1.025)
UROBILINOGEN UA: 0.2 U/dL

## 2017-07-01 NOTE — Patient Instructions (Signed)

## 2017-07-01 NOTE — Progress Notes (Signed)
ROB, doing well. Discussed common musculskeletal discomforts of pregnancy also reviewed ROUND ligament pain . Encouraged belly band use, ice to back prn, tylenol, and warm baths. She verbalizes understanding and agrees. Follow up as scheduled in 2 wks for q hr GTT.    Doreene Burke, CNM

## 2017-07-01 NOTE — Progress Notes (Signed)
ER follow up- pt went to ER 06/28/17, she is still having pressure, having terrible migraines

## 2017-07-15 ENCOUNTER — Ambulatory Visit (INDEPENDENT_AMBULATORY_CARE_PROVIDER_SITE_OTHER): Payer: Medicaid Other | Admitting: Certified Nurse Midwife

## 2017-07-15 ENCOUNTER — Other Ambulatory Visit (INDEPENDENT_AMBULATORY_CARE_PROVIDER_SITE_OTHER): Payer: Medicaid Other

## 2017-07-15 ENCOUNTER — Encounter: Payer: Self-pay | Admitting: Certified Nurse Midwife

## 2017-07-15 ENCOUNTER — Other Ambulatory Visit: Payer: Medicaid Other

## 2017-07-15 VITALS — BP 122/67 | HR 83 | Wt 163.2 lb

## 2017-07-15 DIAGNOSIS — Z8759 Personal history of other complications of pregnancy, childbirth and the puerperium: Secondary | ICD-10-CM

## 2017-07-15 DIAGNOSIS — Z130 Encounter for screening for diseases of the blood and blood-forming organs and certain disorders involving the immune mechanism: Secondary | ICD-10-CM

## 2017-07-15 DIAGNOSIS — Z9889 Other specified postprocedural states: Secondary | ICD-10-CM

## 2017-07-15 DIAGNOSIS — Z3492 Encounter for supervision of normal pregnancy, unspecified, second trimester: Secondary | ICD-10-CM

## 2017-07-15 HISTORY — DX: Other specified postprocedural states: Z98.890

## 2017-07-15 LAB — POCT URINALYSIS DIPSTICK
BILIRUBIN UA: NEGATIVE
Blood, UA: NEGATIVE
Glucose, UA: NEGATIVE
Ketones, UA: NEGATIVE
Leukocytes, UA: NEGATIVE
Nitrite, UA: NEGATIVE
PH UA: 6 (ref 5.0–8.0)
Protein, UA: NEGATIVE
Spec Grav, UA: 1.015 (ref 1.010–1.025)
Urobilinogen, UA: 0.2 E.U./dL

## 2017-07-15 NOTE — Progress Notes (Signed)
ROB-Doing well, nausea has resolved. Reports intermittent constipation, reviewed home treatment measures. Plans fiance and mother as labor support. Glucola, CBC, CMP, Uric Acid, and Protein/Creatinine ratio today. Growth [place] for history of IUGR today, findings WNL. Education regarding intrapartum pain management options including continuous labor support, childbirth classes and postpartum contraception; handouts given. Patient would like to discuss breastfeeding after reduction with lactation consultant, see orders. Discussed risks vs benefits of TOLAC vs repeat C-section.  Risk of uterine rupture at term is ~ 1%.  Risk of failed trial of labor after cesarean (TOLAC) without a vaginal birth after cesarean (VBAC) resulting in repeat cesarean delivery (RCD) in about 20 to 40 percent of women who attempt VBAC.  Risk of requiring emergency C-section due to uterine rupture discussed. The benefits of a trial of labor after cesarean (TOLAC) resulting in a vaginal birth after cesarean (VBAC) include the following: shorter length of hospital stay and postpartum recovery (in most cases); fewer complications, such as postpartum fever, wound or uterine infection, thromboembolism (blood clots in the leg or lung), need for blood transfusion and fewer neonatal breathing problems.  Patient notes understanding. Still desires TOLAC. Will schedule meeting with MD between 32-34 weeks. Blood transfusion consent signed and faxed. Reviewed red flag symptoms and when to call. RTC x 2 weeks for ROB or sooner if needed.   ULTRASOUND REPORT  Location: [facility] Date of Service:  07/15/2017  Indications: Growth for Hx IUGR Findings:  Singleton intrauterine pregnancy is visualized with FHR at 147 BPM. Biometrics give an (U/S) Gestational age of 71 4/7 weeks and an (U/S) EDD of 10/10/17; this correlates with the clinically established EDD of 10/08/17.  Fetal presentation is breech.  EFW: 1033 grams (2lb 4oz).  39th  percentile. Placenta: Anterior and grade 2. AFI: WNL with MVP measuring 4.8 cm.  Fetal stomach, kidneys, and bladder appears WNL.  Impression: 1. 27 4/7 week Viable Singleton Intrauterine pregnancy by U/S. 2. (U/S) EDD is consistent with Clinically established (LMP) EDD of 10/08/17. 3. EFW: 1033 grams (2lb 4oz).  39th percentile.  Recommendations: 1.Clinical correlation with the patient's History and Physical Exam.

## 2017-07-15 NOTE — Patient Instructions (Addendum)
Back Pain in Pregnancy Back pain during pregnancy is common. Back pain may be caused by several factors that are related to changes during your pregnancy. Follow these instructions at home: Managing pain, stiffness, and swelling  If directed, apply ice for sudden (acute) back pain. ? Put ice in a plastic bag. ? Place a towel between your skin and the bag. ? Leave the ice on for 20 minutes, 2-3 times per day.  If directed, apply heat to the affected area before you exercise: ? Place a towel between your skin and the heat pack or heating pad. ? Leave the heat on for 20-30 minutes. ? Remove the heat if your skin turns bright red. This is especially important if you are unable to feel pain, heat, or cold. You may have a greater risk of getting burned. Activity  Exercise as told by your health care provider. Exercising is the best way to prevent or manage back pain.  Listen to your body when lifting. If lifting hurts, ask for help or bend your knees. This uses your leg muscles instead of your back muscles.  Squat down when picking up something from the floor. Do not bend over.  Only use bed rest as told by your health care provider. Bed rest should only be used for the most severe episodes of back pain. Standing, Sitting, and Lying Down  Do not stand in one place for long periods of time.  Use good posture when sitting. Make sure your head rests over your shoulders and is not hanging forward. Use a pillow on your lower back if necessary.  Try sleeping on your side, preferably the left side, with a pillow or two between your legs. If you are sore after a night's rest, your bed may be too soft. A firm mattress may provide more support for your back during pregnancy. General instructions  Do not wear high heels.  Eat a healthy diet. Try to gain weight within your health care provider's recommendations.  Use a maternity girdle, elastic sling, or back brace as told by your health care  provider.  Take over-the-counter and prescription medicines only as told by your health care provider.  Keep all follow-up visits as told by your health care provider. This is important. This includes any visits with any specialists, such as a physical therapist. Contact a health care provider if:  Your back pain interferes with your daily activities.  You have increasing pain in other parts of your body. Get help right away if:  You develop numbness, tingling, weakness, or problems with the use of your arms or legs.  You develop severe back pain that is not controlled with medicine.  You have a sudden change in bowel or bladder control.  You develop shortness of breath, dizziness, or you faint.  You develop nausea, vomiting, or sweating.  You have back pain that is a rhythmic, cramping pain similar to labor pains. Labor pain is usually 1-2 minutes apart, lasts for about 1 minute, and involves a bearing down feeling or pressure in your pelvis.  You have back pain and your water breaks or you have vaginal bleeding.  You have back pain or numbness that travels down your leg.  Your back pain developed after you fell.  You develop pain on one side of your back.  You see blood in your urine.  You develop skin blisters in the area of your back pain. This information is not intended to replace advice given to you   by your health care provider. Make sure you discuss any questions you have with your health care provider. Document Released: 09/08/2005 Document Revised: 11/06/2015 Document Reviewed: 02/12/2015 Elsevier Interactive Patient Education  2018 [place]. Abdominal Pain During Pregnancy Belly (abdominal) pain is common during pregnancy. Most of the time, it is not a serious problem. Other times, it can be a sign that something is wrong with the pregnancy. Always tell your doctor if you have belly pain. Follow these instructions at home: Monitor your belly pain for any  changes. The following actions may help you feel better:  Do not have sex (intercourse) or put anything in your vagina until you feel better.  Rest until your pain stops.  Drink clear fluids if you feel sick to your stomach (nauseous). Do not eat solid food until you feel better.  Only take medicine as told by your doctor.  Keep all doctor visits as told.  Get help right away if:  You are bleeding, leaking fluid, or pieces of tissue come out of your vagina.  You have more pain or cramping.  You keep throwing up (vomiting).  You have pain when you pee (urinate) or have blood in your pee.  You have a fever.  You do not feel your baby moving as much.  You feel very weak or feel like passing out.  You have trouble breathing, with or without belly pain.  You have a very bad headache and belly pain.  You have fluid leaking from your vagina and belly pain.  You keep having watery poop (diarrhea).  Your belly pain does not go away after resting, or the pain gets worse. This information is not intended to replace advice given to you by your health care provider. Make sure you discuss any questions you have with your health care provider. Document Released: 05/19/2009 Document Revised: 01/07/2016 Document Reviewed: 12/28/2012 Elsevier Interactive Patient Education  2018 [employer]. Breastfeeding Choosing to breastfeed is one of the best decisions you can make for yourself and your baby. A change in hormones during pregnancy causes your breasts to make breast milk in your milk-producing glands. Hormones prevent breast milk from being released before your baby is born. They also prompt milk flow after birth. Once breastfeeding has begun, thoughts of your baby, as well as his or her sucking or crying, can stimulate the release of milk from your milk-producing glands. Benefits of breastfeeding Research shows that breastfeeding offers many health benefits for infants and mothers. It  also offers a cost-free and convenient way to feed your baby. For your baby  Your first milk (colostrum) helps your baby's digestive system to function better.  Special cells in your milk (antibodies) help your baby to fight off infections.  Breastfed babies are less likely to develop asthma, allergies, obesity, or type 2 diabetes. They are also at lower risk for sudden infant death syndrome (SIDS).  Nutrients in breast milk are better able to meet your baby's needs compared to infant formula.  Breast milk improves your baby's brain development. For you  Breastfeeding helps to create a very special bond between you and your baby.  Breastfeeding is convenient. Breast milk costs nothing and is always available at the correct temperature.  Breastfeeding helps to burn calories. It helps you to lose the weight that you gained during pregnancy.  Breastfeeding makes your uterus return faster to its size before pregnancy. It also slows bleeding (lochia) after you give birth.  Breastfeeding helps to lower your risk  of developing type 2 diabetes, osteoporosis, rheumatoid arthritis, cardiovascular disease, and breast, ovarian, uterine, and endometrial cancer later in life. Breastfeeding basics Starting breastfeeding  Find a comfortable place to sit or lie down, with your neck and back well-supported.  Place a pillow or a rolled-up blanket under your baby to bring him or her to the level of your breast (if you are seated). Nursing pillows are specially designed to help support your arms and your baby while you breastfeed.  Make sure that your baby's tummy (abdomen) is facing your abdomen.  Gently massage your breast. With your fingertips, massage from the outer edges of your breast inward toward the nipple. This encourages milk flow. If your milk flows slowly, you may need to continue this action during the feeding.  Support your breast with 4 fingers underneath and your thumb above your nipple  (make the letter "C" with your hand). Make sure your fingers are well away from your nipple and your baby's mouth.  Stroke your baby's lips gently with your finger or nipple.  When your baby's mouth is open wide enough, quickly bring your baby to your breast, placing your entire nipple and as much of the areola as possible into your baby's mouth. The areola is the colored area around your nipple. ? More areola should be visible above your baby's upper lip than below the lower lip. ? Your baby's lips should be opened and extended outward (flanged) to ensure an adequate, comfortable latch. ? Your baby's tongue should be between his or her lower gum and your breast.  Make sure that your baby's mouth is correctly positioned around your nipple (latched). Your baby's lips should create a seal on your breast and be turned out (everted).  It is common for your baby to suck about 2-3 minutes in order to start the flow of breast milk. Latching Teaching your baby how to latch onto your breast properly is very important. An improper latch can cause nipple pain, decreased milk supply, and poor weight gain in your baby. Also, if your baby is not latched onto your nipple properly, he or she may swallow some air during feeding. This can make your baby fussy. Burping your baby when you switch breasts during the feeding can help to get rid of the air. However, teaching your baby to latch on properly is still the best way to prevent fussiness from swallowing air while breastfeeding. Signs that your baby has successfully latched onto your nipple  Silent tugging or silent sucking, without causing you pain. Infant's lips should be extended outward (flanged).  Swallowing heard between every 3-4 sucks once your milk has started to flow (after your let-down milk reflex occurs).  Muscle movement above and in front of his or her ears while sucking.  Signs that your baby has not successfully latched onto your  nipple  Sucking sounds or smacking sounds from your baby while breastfeeding.  Nipple pain.  If you think your baby has not latched on correctly, slip your finger into the corner of your baby's mouth to break the suction and place it between your baby's gums. Attempt to start breastfeeding again. Signs of successful breastfeeding Signs from your baby  Your baby will gradually decrease the number of sucks or will completely stop sucking.  Your baby will fall asleep.  Your baby's body will relax.  Your baby will retain a small amount of milk in his or her mouth.  Your baby will let go of your breast by  himself or herself.  Signs from you  Breasts that have increased in firmness, weight, and size 1-3 hours after feeding.  Breasts that are softer immediately after breastfeeding.  Increased milk volume, as well as a change in milk consistency and color by the fifth day of breastfeeding.  Nipples that are not sore, cracked, or bleeding.  Signs that your baby is getting enough milk  Wetting at least 1-2 diapers during the first 24 hours after birth.  Wetting at least 5-6 diapers every 24 hours for the first week after birth. The urine should be clear or pale yellow by the age of 5 days.  Wetting 6-8 diapers every 24 hours as your baby continues to grow and develop.  At least 3 stools in a 24-hour period by the age of 5 days. The stool should be soft and yellow.  At least 3 stools in a 24-hour period by the age of 7 days. The stool should be seedy and yellow.  No loss of weight greater than 10% of birth weight during the first 3 days of life.  Average weight gain of 4-7 oz (113-198 g) per week after the age of 4 days.  Consistent daily weight gain by the age of 5 days, without weight loss after the age of 2 weeks. After a feeding, your baby may spit up a small amount of milk. This is normal. Breastfeeding frequency and duration Frequent feeding will help you make more milk  and can prevent sore nipples and extremely full breasts (breast engorgement). Breastfeed when you feel the need to reduce the fullness of your breasts or when your baby shows signs of hunger. This is called "breastfeeding on demand." Signs that your baby is hungry include:  Increased alertness, activity, or restlessness.  Movement of the head from side to side.  Opening of the mouth when the corner of the mouth or cheek is stroked (rooting).  Increased sucking sounds, smacking lips, cooing, sighing, or squeaking.  Hand-to-mouth movements and sucking on fingers or hands.  Fussing or crying.  Avoid introducing a pacifier to your baby in the first 4-6 weeks after your baby is born. After this time, you may choose to use a pacifier. Research has shown that pacifier use during the first year of a baby's life decreases the risk of sudden infant death syndrome (SIDS). Allow your baby to feed on each breast as long as he or she wants. When your baby unlatches or falls asleep while feeding from the first breast, offer the second breast. Because newborns are often sleepy in the first few weeks of life, you may need to awaken your baby to get him or her to feed. Breastfeeding times will vary from baby to baby. However, the following rules can serve as a guide to help you make sure that your baby is properly fed:  Newborns (babies 54 weeks of age or younger) may breastfeed every 1-3 hours.  Newborns should not go without breastfeeding for longer than 3 hours during the day or 5 hours during the night.  You should breastfeed your baby a minimum of 8 times in a 24-hour period.  Breast milk pumping Pumping and storing breast milk allows you to make sure that your baby is exclusively fed your breast milk, even at times when you are unable to breastfeed. This is especially important if you go back to work while you are still breastfeeding, or if you are not able to be present during feedings. Your Environmental consultant  can help you find a method of pumping that works best for you and give you guidelines about how long it is safe to store breast milk. Caring for your breasts while you breastfeed Nipples can become dry, cracked, and sore while breastfeeding. The following recommendations can help keep your breasts moisturized and healthy:  Avoid using soap on your nipples.  Wear a supportive bra designed especially for nursing. Avoid wearing underwire-style bras or extremely tight bras (sports bras).  Air-dry your nipples for 3-4 minutes after each feeding.  Use only cotton bra pads to absorb leaked breast milk. Leaking of breast milk between feedings is normal.  Use lanolin on your nipples after breastfeeding. Lanolin helps to maintain your skin's normal moisture barrier. Pure lanolin is not harmful (not toxic) to your baby. You may also hand express a few drops of breast milk and gently massage that milk into your nipples and allow the milk to air-dry.  In the first few weeks after giving birth, some women experience breast engorgement. Engorgement can make your breasts feel heavy, warm, and tender to the touch. Engorgement peaks within 3-5 days after you give birth. The following recommendations can help to ease engorgement:  Completely empty your breasts while breastfeeding or pumping. You may want to start by applying warm, moist heat (in the shower or with warm, water-soaked hand towels) just before feeding or pumping. This increases circulation and helps the milk flow. If your baby does not completely empty your breasts while breastfeeding, pump any extra milk after he or she is finished.  Apply ice packs to your breasts immediately after breastfeeding or pumping, unless this is too uncomfortable for you. To do this: ? Put ice in a plastic bag. ? Place a towel between your skin and the bag. ? Leave the ice on for 20 minutes, 2-3 times a day.  Make sure that your baby is latched on and  positioned properly while breastfeeding.  If engorgement persists after 48 hours of following these recommendations, contact your health care provider or a Science writer. Overall health care recommendations while breastfeeding  Eat 3 healthy meals and 3 snacks every day. Well-nourished mothers who are breastfeeding need an additional 450-500 calories a day. You can meet this requirement by increasing the amount of a balanced diet that you eat.  Drink enough water to keep your urine pale yellow or clear.  Rest often, relax, and continue to take your prenatal vitamins to prevent fatigue, stress, and low vitamin and mineral levels in your body (nutrient deficiencies).  Do not use any products that contain nicotine or tobacco, such as cigarettes and e-cigarettes. Your baby may be harmed by chemicals from cigarettes that pass into breast milk and exposure to secondhand smoke. If you need help quitting, ask your health care provider.  Avoid alcohol.  Do not use illegal drugs or marijuana.  Talk with your health care provider before taking any medicines. These include over-the-counter and prescription medicines as well as vitamins and herbal supplements. Some medicines that may be harmful to your baby can pass through breast milk.  It is possible to become pregnant while breastfeeding. If birth control is desired, ask your health care provider about options that will be safe while breastfeeding your baby. Where to find more information: [employer]: www.llli.org Contact a health care provider if:  You feel like you want to stop breastfeeding or have become frustrated with breastfeeding.  Your nipples are cracked or bleeding.  Your breasts are  red, tender, or warm.  You have: ? Painful breasts or nipples. ? A swollen area on either breast. ? A fever or chills. ? Nausea or vomiting. ? Drainage other than breast milk from your nipples.  Your breasts do not become  full before feedings by the fifth day after you give birth.  You feel sad and depressed.  Your baby is: ? Too sleepy to eat well. ? Having trouble sleeping. ? More than 16 week old and wetting fewer than 6 diapers in a 24-hour period. ? Not gaining weight by 38 days of age.  Your baby has fewer than 3 stools in a 24-hour period.  Your baby's skin or the white parts of his or her eyes become yellow. Get help right away if:  Your baby is overly tired (lethargic) and does not want to wake up and feed.  Your baby develops an unexplained fever. Summary  Breastfeeding offers many health benefits for infant and mothers.  Try to breastfeed your infant when he or she shows early signs of hunger.  Gently tickle or stroke your baby's lips with your finger or nipple to allow the baby to open his or her mouth. Bring the baby to your breast. Make sure that much of the areola is in your baby's mouth. Offer one side and burp the baby before you offer the other side.  Talk with your health care provider or lactation consultant if you have questions or you face problems as you breastfeed. This information is not intended to replace advice given to you by your health care provider. Make sure you discuss any questions you have with your health care provider. Document Released: 05/31/2005 Document Revised: 07/02/2016 Document Reviewed: 07/02/2016 Elsevier Interactive Patient Education  2018 [employer]. Common Medications Safe in Pregnancy  Acne:      Constipation:  Benzoyl Peroxide     Colace  Clindamycin      Dulcolax Suppository  Topica Erythromycin     Fibercon  Salicylic Acid      Metamucil         Miralax AVOID:        Senakot   Accutane    Cough:  Retin-A       Cough Drops  Tetracycline      Phenergan w/ Codeine if Rx  Minocycline      Robitussin (Plain &  DM)  Antibiotics:     Crabs/Lice:  Ceclor       RID  Cephalosporins    AVOID:  E-Mycins      Kwell  Keflex  Macrobid/Macrodantin   Diarrhea:  Penicillin      Kao-Pectate  Zithromax      Imodium AD         PUSH FLUIDS AVOID:       Cipro     Fever:  Tetracycline      Tylenol (Regular or Extra  Minocycline       Strength)  Levaquin      Extra Strength-Do not          Exceed 8 tabs/24 hrs Caffeine:        <256m/day (equiv. To 1 cup of coffee or  approx. 3 12 oz sodas)         Gas: Cold/Hayfever:       Gas-X  Benadryl      Mylicon  Claritin       Phazyme  **Claritin-D        Chlor-Trimeton    Headaches:  Dimetapp  ASA-Free Excedrin  Drixoral-Non-Drowsy     Cold Compress  Mucinex (Guaifenasin)     Tylenol (Regular or Extra  Sudafed/Sudafed-12 Hour     Strength)  **Sudafed PE Pseudoephedrine   Tylenol Cold & Sinus     Vicks Vapor Rub  Zyrtec  **AVOID if Problems With Blood Pressure         Heartburn: Avoid lying down for at least 1 hour after meals  Aciphex      Maalox     Rash:  Milk of Magnesia     Benadryl    Mylanta       1% Hydrocortisone Cream  Pepcid  Pepcid Complete   Sleep Aids:  Prevacid      Ambien   Prilosec       Benadryl  Rolaids       Chamomile Tea  Tums (Limit 4/day)     Unisom  Zantac       Tylenol PM         Warm milk-add vanilla or  Hemorrhoids:       Sugar for taste  Anusol/Anusol H.C.  (RX: Analapram 2.5%)  Sugar Substitutes:  Hydrocortisone OTC     Ok in moderation  Preparation H      Tucks        Vaseline lotion applied to tissue with wiping    Herpes:     Throat:  Acyclovir      Oragel  Famvir  Valtrex     Vaccines:         Flu Shot Leg Cramps:       *Gardasil  Benadryl      Hepatitis A         Hepatitis B Nasal Spray:       Pneumovax  Saline Nasal Spray     Polio Booster         Tetanus Nausea:       Tuberculosis test or PPD  Vitamin B6 25 mg TID   AVOID:    Dramamine      *Gardasil  Emetrol       Live  Poliovirus  Ginger Root 250 mg QID    MMR (measles, mumps &  High Complex Carbs @ Bedtime    rebella)  Sea Bands-Accupressure    Varicella (Chickenpox)  Unisom 1/2 tab TID     *No known complications           If received before Pain:         Known pregnancy;   Darvocet       Resume series after  Lortab        Delivery  Percocet    Yeast:   Tramadol      Femstat  Tylenol 3      Gyne-lotrimin  Ultram       Monistat  Vicodin           MISC:         All Sunscreens           Hair Coloring/highlights          Insect Repellant's          (Including DEET)         Mystic Tans Third Trimester of Pregnancy The third trimester is from week 29 through week 42, months 7 through 9. This trimester is when your unborn baby (fetus) is growing very fast. At the end of the ninth month, the unborn baby is about 20 inches in length. It  weighs about 6-10 pounds. Follow these instructions at home:  Avoid all smoking, herbs, and alcohol. Avoid drugs not approved by your doctor.  Do not use any tobacco products, including cigarettes, chewing tobacco, and electronic cigarettes. If you need help quitting, ask your doctor. You may get counseling or other support to help you quit.  Only take medicine as told by your doctor. Some medicines are safe and some are not during pregnancy.  Exercise only as told by your doctor. Stop exercising if you start having cramps.  Eat regular, healthy meals.  Wear a good support bra if your breasts are tender.  Do not use hot tubs, steam rooms, or saunas.  Wear your seat belt when driving.  Avoid raw meat, uncooked cheese, and liter boxes and soil used by cats.  Take your prenatal vitamins.  Take 1500-2000 milligrams of calcium daily starting at the 20th week of pregnancy until you deliver your baby.  Try taking medicine that helps you poop (stool softener) as needed, and if your doctor approves. Eat more fiber by eating fresh fruit, vegetables, and whole grains.  Drink enough fluids to keep your pee (urine) clear or pale yellow.  Take warm water baths (sitz baths) to soothe pain or discomfort caused by hemorrhoids. Use hemorrhoid cream if your doctor approves.  If you have puffy, bulging veins (varicose veins), wear support hose. Raise (elevate) your feet for 15 minutes, 3-4 times a day. Limit salt in your diet.  Avoid heavy lifting, wear low heels, and sit up straight.  Rest with your legs raised if you have leg cramps or low back pain.  Visit your dentist if you have not gone during your pregnancy. Use a soft toothbrush to brush your teeth. Be gentle when you floss.  You can have sex (intercourse) unless your doctor tells you not to.  Do not travel far distances unless you must. Only do so with your doctor's approval.  Take prenatal classes.  Practice driving to the hospital.  Pack your hospital bag.  Prepare the baby's room.  Go to your doctor visits. Get help if:  You are not sure if you are in labor or if your water has broken.  You are dizzy.  You have mild cramps or pressure in your lower belly (abdominal).  You have a nagging pain in your belly area.  You continue to feel sick to your stomach (nauseous), throw up (vomit), or have watery poop (diarrhea).  You have bad smelling fluid coming from your vagina.  You have pain with peeing (urination). Get help right away if:  You have a fever.  You are leaking fluid from your vagina.  You are spotting or bleeding from your vagina.  You have severe belly cramping or pain.  You lose or gain weight rapidly.  You have trouble catching your breath and have chest pain.  You notice sudden or extreme puffiness (swelling) of your face, hands, ankles, feet, or legs.  You have not felt the baby move in over an hour.  You have severe headaches that do not go away with medicine.  You have vision changes. This information is not intended to replace advice given to you by your  health care provider. Make sure you discuss any questions you have with your health care provider. Document Released: 08/25/2009 Document Revised: 11/06/2015 Document Reviewed: 08/01/2012 Elsevier Interactive Patient Education  2017 [place]  Cord Blood Banking Information Cord blood banking is the process of collecting and storing the blood  that is in the umbilical cord and placenta at the time of delivery. This blood contains stem cells, which can be used to treat many blood diseases, immune system disorders, and childhood cancers. Stem cells can also be used to research certain diseases and treatments. Many people who choose cord blood banking donate the blood. Donated blood can be used in lifesaving treatments or for research. Other people choose to store the blood privately. Blood that is stored privately can only be used with the person's permission. This option is often chosen if: A family member needs a stem cell transplant. The child is part of an ethnic minority. The child was conceived through in vitro fertilization.  What should I look for in a blood bank? A blood bank is the organization that coordinates cord blood banking. Make sure the cord blood bank that you use: Is accredited. Is financially stable. Handles a large volume of cord blood samples. Has a procedure in place for transport and storage. Allows you the option of transferring your cord blood sample. Has a procedure in place if the bank goes out of business. Clearly states all costs and limits to future costs.  People who choose to donate cord blood should not need to pay for blood banking. People who keep the blood for private use will need to pay for the first (initial) storage and pay a fee each year (annual fee). Other fees may also apply. What are the risks of cord blood banking? There are no health risks associated with cord blood banking. It is considered safe. How should I prepare? You must schedule this  process at least 4-6 weeks before you will be giving birth. How is the blood collected? The blood is collected as soon as the baby has been delivered. Within 15 minutes of delivery, a health care provider will take these actions to collect the blood: Clamp the umbilical cord at the top and bottom. This traps the blood in the umbilical cord. Use a syringe or bag to collect the blood. Insert needles into the placenta to collect (draw out) more blood.  What happens after the blood is collected? After the blood has been collected: The blood will be sent to a blood bank. The blood will be tested for genetic problems and infectious diseases. If the blood tests positive for a genetic problem or a disease, someone will contact you and let you know. The blood will be frozen.  If your child develops a genetic condition, immune system disorder, or cancer, you will be responsible for contacting the blood bank and letting them know. This information is not intended to replace advice given to you by your health care provider. Make sure you discuss any questions you have with your health care provider. Document Released: 11/18/2009 Document Revised: 11/06/2015 Document Reviewed: 11/18/2014 Elsevier Interactive Patient Education  2018 [employer].  Pain Relief During Labor and Delivery Many things can cause pain during labor and delivery, including: Pressure on bones and ligaments due to the baby moving through the pelvis. Stretching of tissues due to the baby moving through the birth canal. Muscle tension due to anxiety or nervousness. The uterus tightening (contracting) and relaxing to help move the baby.  There are many ways to deal with the pain of labor and delivery. They include: Taking prenatal classes. Taking these classes helps you know what to expect during your baby's birth. What you learn will increase your confidence and decrease your anxiety. Practicing relaxation techniques or doing  relaxing  activities, such as: Focused breathing. Meditation. Visualization. Aroma therapy. Listening to your favorite music. Hypnosis. Taking a warm shower or bath (hydrotherapy). This may: Provide comfort and relaxation. Lessen your perception of pain. Decrease the amount of pain medicine needed. Decrease the length of labor. Getting a massage or counterpressure on your back. Applying warm packs or ice packs. Changing positions often, moving around, or using a birthing ball. Getting: Pain medicine through an IV or injection into a muscle. Pain medicine inserted into your spinal column. Injections of sterile water just under the skin on your lower back (intradermal injections). Laughing gas (nitrous oxide).  Discuss your pain control options with your health care provider during your prenatal visits. Explore the options offered by your hospital or birth center. What kinds of medicine are available? There are two kinds of medicines that can be used to relieve pain during labor and delivery: Analgesics. These medicines decrease pain without causing you to lose feeling or the ability to move your muscles. Anesthetics. These medicines block feeling in the body and can decrease your ability to move freely.  Both of these kinds of medicine can cause minor side effects, such as nausea, trouble concentrating, and sleepiness. They can also decrease the baby's heart rate before birth and affect the baby's breathing rate after birth. For this reason, health care providers are careful about when and how much medicine is given. What are specific medicines and procedures that provide pain relief? Local Anesthetics Local anesthetics are used to numb a small area of the body. They may be used along with another kind of anesthetic or used to numb the nerves of the vagina, cervix, and perineum during the second stage of labor. General Anesthetics General anesthetics cause you to lose consciousness so  you do not feel pain. They are usually only used for an emergency cesarean delivery. General anesthetics are given through an IV tube and a mask. Pudendal Block A pudendal block is a form of local anesthetic. It may be used to relieve the pain associated with pushing or stretching of the perineum at the time of delivery or to further numb the perineum. A pudendal block is done by injecting numbing medicine through the vaginal wall into a nerve in the pelvis. Epidural Analgesia Epidural analgesia is given through a flexible IV catheter that is inserted into the lower back. Numbing medicine is delivered continuously to the area near your spinal column nerves (epidural space). After having this type of analgesia, you may be able to move your legs but you most likely will not be able to walk. Depending on the amount of medicine given, you may lose all feeling in the lower half of your body, or you may retain some level of sensation, including the urge to push. Epidural analgesia can be used to provide pain relief for a vaginal birth. Spinal Block A spinal block is similar to epidural analgesia, but the medicine is injected into the spinal fluid instead of the epidural space. A spinal block is only given once. It starts to relieve pain quickly, but the pain relief lasts only 1-6 hours. Spinal blocks can be used for cesarean deliveries. Combined Spinal-Epidural (CSE) Block A CSE block combines the effects of a spinal block and epidural analgesia. The spinal block works quickly to block all pain. The epidural analgesia provides continuous pain relief, even after the effects of the spinal block have worn off. This information is not intended to replace advice given to you by your health care  provider. Make sure you discuss any questions you have with your health care provider. Document Released: 09/16/2008 Document Revised: 11/07/2015 Document Reviewed: 10/22/2015 Elsevier Interactive Patient Education  2018  [employer]. Breastfeeding After Breast Surgery Breast surgery may affect your ability to breastfeed. The type of surgery and where the surgical cuts (incisions) were made mayinfluence your milk supply. If your milk ducts and major nerves were not cut, your milk supply may not be affected. If your incisions were made in the fold underneath your breasts or near your armpits, you are less likely to have problems producing milk. If the incisions were made around or across the darker area that surrounds the nipple (areola), you are more likely to have difficulties. This is because some milk ducts were cut, and there may be some nerve damage. A loss of sensation in one or both nipples is a sign of nerve damage. This will affect the let-down reflex and your milk supply. What are the types of breast surgery? Your ability to breastfeed after surgery depends on the type of surgery. Talk with a lactation specialist about breastfeeding if you have had breast surgery. This includes breast reduction, breast augmentation, and breast cancer surgery. If you are considering breast surgery, and you may become pregnant in the future, talk to your surgeon about how the surgery may affect breastfeeding. Here is what you need to know about some common breast surgeries and breastfeeding:  Breast reduction. A breast reduction is surgery to decrease the size of your breasts. If you have had this surgery, it is likely to affect your ability to breastfeed. Sections of the breast, including milk ducts, are often removed during the procedure. In addition, major nerves are usually cut. If your nipple was removed during the surgery and then reattached, all milk ducts and major nerves were cut. This reduces the chance that breastfeeding will be successful.  Breast augmentation. A breast augmentation is surgery to increase the size of your breasts. This surgery may involve placing breast implants into your breasts. Implants are placed  underneath your breast. In most cases, the implants will not affect breastfeeding. If your breast augmentation includes incisions near your nipples, the ducts that carry milk to your nipples may be damaged. Most women can breastfeed after breast augmentation.  Breast cancer surgery. Breast cancer surgery involves removing breast tissue. It often does limit your ability to breastfeed. If you have surgery to remove only the cancer and a small amount of breast tissue (lumpectomy), you may be able to breastfeed. If you have X-ray treatments along with breast surgery, or if you have your breast removed entirely (mastectomy), you will not be able to breastfeed. If you have breast cancer surgery in one breast, you may still be able to breastfeed from your other breast.  What are guidelines to follow after breast surgery? If you have not yet had your surgery, talk to a lactation specialist and your surgeon about whether you will be able to breastfeed after the surgery. Let them know your concerns. Your surgeon can try to preserve as much breast tissue and nerve function as possible. If you want to try breastfeeding after surgery, doing the following may help:  Breastfeed your newborn often.  Feed your baby whenever you notice hunger cues until he or she falls asleep or releases the breast.  Count the number of your baby's wet diapers and bowel movements. Signs that your baby is getting enough milk: ? Wetting at least 3 diapers in a 24-hour  period. The urine should be clear or pale yellow by age 184 days. ? At least 3 stools in a 24-hour period by age 184 days. The stool should be soft and yellow. ? At least 3 stools in a 24-hour period by age 72 days. The stool should be seedy and yellow. ? No loss of weight greater than 10% of birth weight during the first 77 days of age. ? Average weight gain of 4-7 ounces (113-198 g) per week after age 18 days. ? Consistent daily weight gain by age 81 days, without weight loss  after the age of 2 weeks. ? By day 6, expect your baby to have 6-8 wet diapers and 3 stools per day.  Check your baby's weight at 1 week of age to make sure he or she is growing and gaining weight.  If your baby does not get enough milk from breastfeeding alone, it may be possible to breastfeed part of the time. You can supplement feedings with donated milk or formula. Discuss your options with a lactation specialist.  When should you seek medical care?  If you have a hard, sore area in your breast.  If you have flu-like symptoms. This information is not intended to replace advice given to you by your health care provider. Make sure you discuss any questions you have with your health care provider. Document Released: 05/31/2005 Document Revised: 11/06/2015 Document Reviewed: 03/23/2013 Elsevier Interactive Patient Education  2017 [place] After Cesarean Delivery A trial of labor after cesarean delivery (TOLAC) is when a woman tries to give birth vaginally after a previous cesarean delivery. TOLAC may be a safe and appropriate option for you depending on your medical history and other risk factors. When TOLAC is successful and you are able to have a vaginal delivery, this is called a vaginal birth after cesarean delivery (VBAC). Candidates for TOLAC TOLAC is possible for some women who:  Have undergone one or two prior cesarean deliveries in which the incision of the uterus was horizontal (low transverse).  Are carrying twins and have had one prior low transverse incision during a cesarean delivery.  Do not have a vertical (classical) uterine scar.  Have not had a tear in the wall of their uterus (uterine rupture).  TOLAC is also supported for women who meet appropriate criteria and:  Are under the age of 52 years.  Are tall and have a body mass index (BMI) of less than 30.  Have an unknown uterine scar.  Give birth in a facility equipped to handle an emergency  cesarean delivery. This team should be able to handle possible complications such as a uterine rupture.  Have thorough counseling about the benefits and risks of TOLAC.  Have discussed future pregnancy plans with their health care provider.  Plan to have several more pregnancies.  Most successful candidates for TOLAC:  Have had a successful vaginal delivery before or after their cesarean delivery.  Experience labor that begins naturally on or before the due date (40 weeks of gestation).  Do not have a very large (macrosomic) baby.  Had a prior cesarean delivery but are not currently experiencing factors that would prompt a cesarean delivery (such as a breech position).  Had only one prior cesarean delivery.  Had a prior cesarean delivery that was performed early in labor and not after full cervical dilation. TOLAC may be most appropriate for women who meet the above guidelines and who plan to have more pregnancies. TOLAC is  not recommended for home births. Least successful candidates for TOLAC:  Have an induced labor with an unfavorable cervix. An unfavorable cervix is when the cervix is not dilating enough (among other factors).  Have never had a vaginal delivery.  Have had more than two cesarean deliveries.  Have a pregnancy at more than 40 weeks of gestation.  Are pregnant with a baby with a suspected weight greater than 4,000 grams (8 pounds) and who have no prior history of a vaginal delivery.  Have closely spaced pregnancies. Suggested benefits of TOLAC  You may have a faster recovery time.  You may have a shorter stay in the hospital.  You may have less pain and fewer problems than with a cesarean delivery. Women who have a cesarean delivery have a higher chance of needing blood or getting a fever, an infection, or a blood clot in the legs. Suggested risks of TOLAC The highest risk of complications happens to women who attempt a TOLAC and fail. A failed TOLAC  results in an unplanned cesarean delivery. Risks related to [facility] or repeat cesarean deliveries include:  Blood loss.  Infection.  Blood clot.  Injury to surrounding tissues or organs.  Having to remove the uterus (hysterectomy).  Potential problems with the placenta (such as placenta previa or placenta accreta) in future pregnancies.  Although very rare, the main concerns with TOLAC are:  Rupture of the uterine scar from a past cesarean delivery.  Needing an emergency cesarean delivery.  Having a bad outcome for the baby (perinatal morbidity).  Where to find more information:  [employer]: www.acog.[place]: www.midwife.org This information is not intended to replace advice given to you by your health care provider. Make sure you discuss any questions you have with your health care provider. Document Released: 02/16/2011 Document Revised: 04/28/2016 Document Reviewed: 11/20/2012 Elsevier Interactive Patient Education  [employer].

## 2017-07-15 NOTE — Progress Notes (Signed)
ROB- glucola done, blood consent signed, pt is doing well 

## 2017-07-16 LAB — COMPREHENSIVE METABOLIC PANEL
ALK PHOS: 86 IU/L (ref 39–117)
ALT: 13 IU/L (ref 0–32)
AST: 14 IU/L (ref 0–40)
Albumin/Globulin Ratio: 1.1 — ABNORMAL LOW (ref 1.2–2.2)
Albumin: 3.3 g/dL — ABNORMAL LOW (ref 3.5–5.5)
BILIRUBIN TOTAL: 0.2 mg/dL (ref 0.0–1.2)
BUN/Creatinine Ratio: 11 (ref 9–23)
BUN: 5 mg/dL — AB (ref 6–20)
CHLORIDE: 104 mmol/L (ref 96–106)
CO2: 20 mmol/L (ref 20–29)
CREATININE: 0.46 mg/dL — AB (ref 0.57–1.00)
Calcium: 8.7 mg/dL (ref 8.7–10.2)
GFR calc Af Amer: 155 mL/min/{1.73_m2} (ref >59)
GFR calc non Af Amer: 135 mL/min/{1.73_m2} (ref >59)
GLUCOSE: 107 mg/dL — AB (ref 65–99)
Globulin, Total: 3.1 g/dL (ref 1.5–4.5)
Potassium: 3.6 mmol/L (ref 3.5–5.2)
Sodium: 140 mmol/L (ref 134–144)
TOTAL PROTEIN: 6.4 g/dL (ref 6.0–8.5)

## 2017-07-16 LAB — PROTEIN / CREATININE RATIO, URINE
Creatinine, Urine: 149.8 mg/dL
PROTEIN UR: 23.5 mg/dL
PROTEIN/CREAT RATIO: 157 mg/g{creat} (ref 0–200)

## 2017-07-16 LAB — CBC
HEMATOCRIT: 34.6 % (ref 34.0–46.6)
HEMOGLOBIN: 11.5 g/dL (ref 11.1–15.9)
MCH: 31 pg (ref 26.6–33.0)
MCHC: 33.2 g/dL (ref 31.5–35.7)
MCV: 93 fL (ref 79–97)
Platelets: 221 10*3/uL (ref 150–379)
RBC: 3.71 x10E6/uL — ABNORMAL LOW (ref 3.77–5.28)
RDW: 13.2 % (ref 12.3–15.4)
WBC: 8.4 10*3/uL (ref 3.4–10.8)

## 2017-07-16 LAB — URIC ACID: URIC ACID: 3.2 mg/dL (ref 2.5–7.1)

## 2017-07-16 LAB — RPR: RPR Ser Ql: NONREACTIVE

## 2017-07-16 LAB — GLUCOSE, 1 HOUR GESTATIONAL: Gestational Diabetes Screen: 102 mg/dL (ref 65–139)

## 2017-07-19 ENCOUNTER — Encounter: Payer: Self-pay | Admitting: Certified Nurse Midwife

## 2017-07-20 ENCOUNTER — Other Ambulatory Visit: Payer: Self-pay

## 2017-07-20 MED ORDER — RANITIDINE HCL 150 MG PO TABS: 150.0000 mg | ORAL_TABLET | Freq: Two times a day (BID) | ORAL | 3 refills | Status: DC

## 2017-07-29 ENCOUNTER — Ambulatory Visit (INDEPENDENT_AMBULATORY_CARE_PROVIDER_SITE_OTHER): Payer: Medicaid Other | Admitting: Certified Nurse Midwife

## 2017-07-29 ENCOUNTER — Encounter: Payer: Self-pay | Admitting: Certified Nurse Midwife

## 2017-07-29 VITALS — BP 103/57 | HR 87 | Wt 165.7 lb

## 2017-07-29 DIAGNOSIS — Z3A29 29 weeks gestation of pregnancy: Secondary | ICD-10-CM

## 2017-07-29 LAB — POCT URINALYSIS DIPSTICK
BILIRUBIN UA: NEGATIVE
GLUCOSE UA: NEGATIVE
KETONES UA: NEGATIVE
Leukocytes, UA: NEGATIVE
Nitrite, UA: NEGATIVE
Protein, UA: NEGATIVE
RBC UA: NEGATIVE
SPEC GRAV UA: 1.02 (ref 1.010–1.025)
Urobilinogen, UA: 0.2 E.U./dL
pH, UA: 6.5 (ref 5.0–8.0)

## 2017-07-29 NOTE — Patient Instructions (Signed)

## 2017-07-29 NOTE — Progress Notes (Signed)
ROB doing well. She complains of itching x 1 wk. She states that it is worse at night . She had tried bendryl and it does help. She denies any rash. ICP labs today. Will follow up with result. Discussed fetal movement. Follow up 2 wks.   Doreene Burke, CNM

## 2017-07-29 NOTE — Progress Notes (Signed)
ROB pt is having really bad heartburn and itching. Itching starts at night and start at feet and goes to top of body. Want to know what she can do about heartburn and itching.

## 2017-07-31 LAB — COMPREHENSIVE METABOLIC PANEL
ALBUMIN: 3.3 g/dL — AB (ref 3.5–5.5)
ALK PHOS: 116 IU/L (ref 39–117)
ALT: 24 IU/L (ref 0–32)
AST: 14 IU/L (ref 0–40)
Albumin/Globulin Ratio: 1 — ABNORMAL LOW (ref 1.2–2.2)
BILIRUBIN TOTAL: 0.4 mg/dL (ref 0.0–1.2)
BUN / CREAT RATIO: 10 (ref 9–23)
BUN: 6 mg/dL (ref 6–20)
CHLORIDE: 104 mmol/L (ref 96–106)
CO2: 21 mmol/L (ref 20–29)
Calcium: 8.6 mg/dL — ABNORMAL LOW (ref 8.7–10.2)
Creatinine, Ser: 0.59 mg/dL (ref 0.57–1.00)
GFR calc Af Amer: 143 mL/min/{1.73_m2} (ref >59)
GFR calc non Af Amer: 124 mL/min/{1.73_m2} (ref >59)
GLOBULIN, TOTAL: 3.4 g/dL (ref 1.5–4.5)
Glucose: 88 mg/dL (ref 65–99)
Potassium: 3.5 mmol/L (ref 3.5–5.2)
SODIUM: 139 mmol/L (ref 134–144)
Total Protein: 6.7 g/dL (ref 6.0–8.5)

## 2017-07-31 LAB — BILIRUBIN, DIRECT: BILIRUBIN, DIRECT: 0.13 mg/dL (ref 0.00–0.40)

## 2017-07-31 LAB — BILE ACIDS, TOTAL: Bile Acids Total: 11.5 umol/L (ref 4.7–24.5)

## 2017-08-01 ENCOUNTER — Encounter: Payer: Self-pay | Admitting: Certified Nurse Midwife

## 2017-08-11 ENCOUNTER — Ambulatory Visit (INDEPENDENT_AMBULATORY_CARE_PROVIDER_SITE_OTHER): Payer: Medicaid Other | Admitting: Obstetrics and Gynecology

## 2017-08-11 VITALS — BP 106/68 | HR 85 | Wt 171.9 lb

## 2017-08-11 DIAGNOSIS — K219 Gastro-esophageal reflux disease without esophagitis: Secondary | ICD-10-CM

## 2017-08-11 DIAGNOSIS — Z98891 History of uterine scar from previous surgery: Secondary | ICD-10-CM

## 2017-08-11 DIAGNOSIS — O0993 Supervision of high risk pregnancy, unspecified, third trimester: Secondary | ICD-10-CM

## 2017-08-11 DIAGNOSIS — O99613 Diseases of the digestive system complicating pregnancy, third trimester: Secondary | ICD-10-CM

## 2017-08-11 LAB — POCT URINALYSIS DIPSTICK
Bilirubin, UA: NEGATIVE
GLUCOSE UA: NEGATIVE
KETONES UA: NEGATIVE
LEUKOCYTES UA: NEGATIVE
NITRITE UA: NEGATIVE
Protein, UA: NEGATIVE
RBC UA: NEGATIVE
Spec Grav, UA: 1.01 (ref 1.010–1.025)
Urobilinogen, UA: 0.2 E.U./dL
pH, UA: 7 (ref 5.0–8.0)

## 2017-08-11 MED ORDER — PANTOPRAZOLE SODIUM 20 MG PO TBEC: 20.0000 mg | DELAYED_RELEASE_TABLET | Freq: Two times a day (BID) | ORAL | 2 refills | Status: DC

## 2017-08-11 NOTE — Patient Instructions (Addendum)
Breastfeeding Choosing to breastfeed is one of the best decisions you can make for yourself and your baby. A change in hormones during pregnancy causes your breasts to make breast milk in your milk-producing glands. Hormones prevent breast milk from being released before your baby is born. They also prompt milk flow after birth. Once breastfeeding has begun, thoughts of your baby, as well as his or her sucking or crying, can stimulate the release of milk from your milk-producing glands. Benefits of breastfeeding Research shows that breastfeeding offers many health benefits for infants and mothers. It also offers a cost-free and convenient way to feed your baby. For your baby  Your first milk (colostrum) helps your baby's digestive system to function better.  Special cells in your milk (antibodies) help your baby to fight off infections.  Breastfed babies are less likely to develop asthma, allergies, obesity, or type 2 diabetes. They are also at lower risk for sudden infant death syndrome (SIDS).  Nutrients in breast milk are better able to meet your baby's needs compared to infant formula.  Breast milk improves your baby's brain development. For you  Breastfeeding helps to create a very special bond between you and your baby.  Breastfeeding is convenient. Breast milk costs nothing and is always available at the correct temperature.  Breastfeeding helps to burn calories. It helps you to lose the weight that you gained during pregnancy.  Breastfeeding makes your uterus return faster to its size before pregnancy. It also slows bleeding (lochia) after you give birth.  Breastfeeding helps to lower your risk of developing type 2 diabetes, osteoporosis, rheumatoid arthritis, cardiovascular disease, and breast, ovarian, uterine, and endometrial cancer later in life. Breastfeeding basics Starting breastfeeding  Find a comfortable place to sit or lie down, with your neck and back  well-supported.  Place a pillow or a rolled-up blanket under your baby to bring him or her to the level of your breast (if you are seated). Nursing pillows are specially designed to help support your arms and your baby while you breastfeed.  Make sure that your baby's tummy (abdomen) is facing your abdomen.  Gently massage your breast. With your fingertips, massage from the outer edges of your breast inward toward the nipple. This encourages milk flow. If your milk flows slowly, you may need to continue this action during the feeding.  Support your breast with 4 fingers underneath and your thumb above your nipple (make the letter "C" with your hand). Make sure your fingers are well away from your nipple and your baby's mouth.  Stroke your baby's lips gently with your finger or nipple.  When your baby's mouth is open wide enough, quickly bring your baby to your breast, placing your entire nipple and as much of the areola as possible into your baby's mouth. The areola is the colored area around your nipple. ? More areola should be visible above your baby's upper lip than below the lower lip. ? Your baby's lips should be opened and extended outward (flanged) to ensure an adequate, comfortable latch. ? Your baby's tongue should be between his or her lower gum and your breast.  Make sure that your baby's mouth is correctly positioned around your nipple (latched). Your baby's lips should create a seal on your breast and be turned out (everted).  It is common for your baby to suck about 2-3 minutes in order to start the flow of breast milk. Latching Teaching your baby how to latch onto your breast properly is very  important. An improper latch can cause nipple pain, decreased milk supply, and poor weight gain in your baby. Also, if your baby is not latched onto your nipple properly, he or she may swallow some air during feeding. This can make your baby fussy. Burping your baby when you switch breasts  during the feeding can help to get rid of the air. However, teaching your baby to latch on properly is still the best way to prevent fussiness from swallowing air while breastfeeding. Signs that your baby has successfully latched onto your nipple  Silent tugging or silent sucking, without causing you pain. Infant's lips should be extended outward (flanged).  Swallowing heard between every 3-4 sucks once your milk has started to flow (after your let-down milk reflex occurs).  Muscle movement above and in front of his or her ears while sucking.  Signs that your baby has not successfully latched onto your nipple  Sucking sounds or smacking sounds from your baby while breastfeeding.  Nipple pain.  If you think your baby has not latched on correctly, slip your finger into the corner of your baby's mouth to break the suction and place it between your baby's gums. Attempt to start breastfeeding again. Signs of successful breastfeeding Signs from your baby  Your baby will gradually decrease the number of sucks or will completely stop sucking.  Your baby will fall asleep.  Your baby's body will relax.  Your baby will retain a small amount of milk in his or her mouth.  Your baby will let go of your breast by himself or herself.  Signs from you  Breasts that have increased in firmness, weight, and size 1-3 hours after feeding.  Breasts that are softer immediately after breastfeeding.  Increased milk volume, as well as a change in milk consistency and color by the fifth day of breastfeeding.  Nipples that are not sore, cracked, or bleeding.  Signs that your baby is getting enough milk  Wetting at least 1-2 diapers during the first 24 hours after birth.  Wetting at least 5-6 diapers every 24 hours for the first week after birth. The urine should be clear or pale yellow by the age of 5 days.  Wetting 6-8 diapers every 24 hours as your baby continues to grow and develop.  At least 3  stools in a 24-hour period by the age of 5 days. The stool should be soft and yellow.  At least 3 stools in a 24-hour period by the age of 7 days. The stool should be seedy and yellow.  No loss of weight greater than 10% of birth weight during the first 3 days of life.  Average weight gain of 4-7 oz (113-198 g) per week after the age of 4 days.  Consistent daily weight gain by the age of 5 days, without weight loss after the age of 2 weeks. After a feeding, your baby may spit up a small amount of milk. This is normal. Breastfeeding frequency and duration Frequent feeding will help you make more milk and can prevent sore nipples and extremely full breasts (breast engorgement). Breastfeed when you feel the need to reduce the fullness of your breasts or when your baby shows signs of hunger. This is called "breastfeeding on demand." Signs that your baby is hungry include:  Increased alertness, activity, or restlessness.  Movement of the head from side to side.  Opening of the mouth when the corner of the mouth or cheek is stroked (rooting).  Increased sucking sounds,  smacking lips, cooing, sighing, or squeaking.  Hand-to-mouth movements and sucking on fingers or hands.  Fussing or crying.  Avoid introducing a pacifier to your baby in the first 4-6 weeks after your baby is born. After this time, you may choose to use a pacifier. Research has shown that pacifier use during the first year of a baby's life decreases the risk of sudden infant death syndrome (SIDS). Allow your baby to feed on each breast as long as he or she wants. When your baby unlatches or falls asleep while feeding from the first breast, offer the second breast. Because newborns are often sleepy in the first few weeks of life, you may need to awaken your baby to get him or her to feed. Breastfeeding times will vary from baby to baby. However, the following rules can serve as a guide to help you make sure that your baby is  properly fed:  Newborns (babies 47 weeks of age or younger) may breastfeed every 1-3 hours.  Newborns should not go without breastfeeding for longer than 3 hours during the day or 5 hours during the night.  You should breastfeed your baby a minimum of 8 times in a 24-hour period.  Breast milk pumping Pumping and storing breast milk allows you to make sure that your baby is exclusively fed your breast milk, even at times when you are unable to breastfeed. This is especially important if you go back to work while you are still breastfeeding, or if you are not able to be present during feedings. Your lactation consultant can help you find a method of pumping that works best for you and give you guidelines about how long it is safe to store breast milk. Caring for your breasts while you breastfeed Nipples can become dry, cracked, and sore while breastfeeding. The following recommendations can help keep your breasts moisturized and healthy:  Avoid using soap on your nipples.  Wear a supportive bra designed especially for nursing. Avoid wearing underwire-style bras or extremely tight bras (sports bras).  Air-dry your nipples for 3-4 minutes after each feeding.  Use only cotton bra pads to absorb leaked breast milk. Leaking of breast milk between feedings is normal.  Use lanolin on your nipples after breastfeeding. Lanolin helps to maintain your skin's normal moisture barrier. Pure lanolin is not harmful (not toxic) to your baby. You may also hand express a few drops of breast milk and gently massage that milk into your nipples and allow the milk to air-dry.  In the first few weeks after giving birth, some women experience breast engorgement. Engorgement can make your breasts feel heavy, warm, and tender to the touch. Engorgement peaks within 3-5 days after you give birth. The following recommendations can help to ease engorgement:  Completely empty your breasts while breastfeeding or pumping. You  may want to start by applying warm, moist heat (in the shower or with warm, water-soaked hand towels) just before feeding or pumping. This increases circulation and helps the milk flow. If your baby does not completely empty your breasts while breastfeeding, pump any extra milk after he or she is finished.  Apply ice packs to your breasts immediately after breastfeeding or pumping, unless this is too uncomfortable for you. To do this: ? Put ice in a plastic bag. ? Place a towel between your skin and the bag. ? Leave the ice on for 20 minutes, 2-3 times a day.  Make sure that your baby is latched on and positioned properly while  breastfeeding.  If engorgement persists after 48 hours of following these recommendations, contact your health care provider or a Advertising copywriter. Overall health care recommendations while breastfeeding  Eat 3 healthy meals and 3 snacks every day. Well-nourished mothers who are breastfeeding need an additional 450-500 calories a day. You can meet this requirement by increasing the amount of a balanced diet that you eat.  Drink enough water to keep your urine pale yellow or clear.  Rest often, relax, and continue to take your prenatal vitamins to prevent fatigue, stress, and low vitamin and mineral levels in your body (nutrient deficiencies).  Do not use any products that contain nicotine or tobacco, such as cigarettes and e-cigarettes. Your baby may be harmed by chemicals from cigarettes that pass into breast milk and exposure to secondhand smoke. If you need help quitting, ask your health care provider.  Avoid alcohol.  Do not use illegal drugs or marijuana.  Talk with your health care provider before taking any medicines. These include over-the-counter and prescription medicines as well as vitamins and herbal supplements. Some medicines that may be harmful to your baby can pass through breast milk.  It is possible to become pregnant while breastfeeding. If  birth control is desired, ask your health care provider about options that will be safe while breastfeeding your baby. Where to find more information: [employer]: www.llli.org Contact a health care provider if:  You feel like you want to stop breastfeeding or have become frustrated with breastfeeding.  Your nipples are cracked or bleeding.  Your breasts are red, tender, or warm.  You have: ? Painful breasts or nipples. ? A swollen area on either breast. ? A fever or chills. ? Nausea or vomiting. ? Drainage other than breast milk from your nipples.  Your breasts do not become full before feedings by the fifth day after you give birth.  You feel sad and depressed.  Your baby is: ? Too sleepy to eat well. ? Having trouble sleeping. ? More than 45 week old and wetting fewer than 6 diapers in a 24-hour period. ? Not gaining weight by 81 days of age.  Your baby has fewer than 3 stools in a 24-hour period.  Your baby's skin or the white parts of his or her eyes become yellow. Get help right away if:  Your baby is overly tired (lethargic) and does not want to wake up and feed.  Your baby develops an unexplained fever. Summary  Breastfeeding offers many health benefits for infant and mothers.  Try to breastfeed your infant when he or she shows early signs of hunger.  Gently tickle or stroke your baby's lips with your finger or nipple to allow the baby to open his or her mouth. Bring the baby to your breast. Make sure that much of the areola is in your baby's mouth. Offer one side and burp the baby before you offer the other side.  Talk with your health care provider or lactation consultant if you have questions or you face problems as you breastfeed. This information is not intended to replace advice given to you by your health care provider. Make sure you discuss any questions you have with your health care provider. Document Released: 05/31/2005 Document  Revised: 07/02/2016 Document Reviewed: 07/02/2016 Elsevier Interactive Patient Education  2018 [employer]. Breastfeeding After Breast Surgery Breast surgery may affect your ability to breastfeed. The type of surgery and where the surgical cuts (incisions) were made mayinfluence your milk supply. If  your milk ducts and major nerves were not cut, your milk supply may not be affected. If your incisions were made in the fold underneath your breasts or near your armpits, you are less likely to have problems producing milk. If the incisions were made around or across the darker area that surrounds the nipple (areola), you are more likely to have difficulties. This is because some milk ducts were cut, and there may be some nerve damage. A loss of sensation in one or both nipples is a sign of nerve damage. This will affect the let-down reflex and your milk supply. What are the types of breast surgery? Your ability to breastfeed after surgery depends on the type of surgery. Talk with a lactation specialist about breastfeeding if you have had breast surgery. This includes breast reduction, breast augmentation, and breast cancer surgery. If you are considering breast surgery, and you may become pregnant in the future, talk to your surgeon about how the surgery may affect breastfeeding. Here is what you need to know about some common breast surgeries and breastfeeding:  Breast reduction. A breast reduction is surgery to decrease the size of your breasts. If you have had this surgery, it is likely to affect your ability to breastfeed. Sections of the breast, including milk ducts, are often removed during the procedure. In addition, major nerves are usually cut. If your nipple was removed during the surgery and then reattached, all milk ducts and major nerves were cut. This reduces the chance that breastfeeding will be successful.  Breast augmentation. A breast augmentation is surgery to increase the size of your  breasts. This surgery may involve placing breast implants into your breasts. Implants are placed underneath your breast. In most cases, the implants will not affect breastfeeding. If your breast augmentation includes incisions near your nipples, the ducts that carry milk to your nipples may be damaged. Most women can breastfeed after breast augmentation.  Breast cancer surgery. Breast cancer surgery involves removing breast tissue. It often does limit your ability to breastfeed. If you have surgery to remove only the cancer and a small amount of breast tissue (lumpectomy), you may be able to breastfeed. If you have X-ray treatments along with breast surgery, or if you have your breast removed entirely (mastectomy), you will not be able to breastfeed. If you have breast cancer surgery in one breast, you may still be able to breastfeed from your other breast.  What are guidelines to follow after breast surgery? If you have not yet had your surgery, talk to a lactation specialist and your surgeon about whether you will be able to breastfeed after the surgery. Let them know your concerns. Your surgeon can try to preserve as much breast tissue and nerve function as possible. If you want to try breastfeeding after surgery, doing the following may help:  Breastfeed your newborn often.  Feed your baby whenever you notice hunger cues until he or she falls asleep or releases the breast.  Count the number of your baby's wet diapers and bowel movements. Signs that your baby is getting enough milk: ? Wetting at least 3 diapers in a 24-hour period. The urine should be clear or pale yellow by age 76 days. ? At least 3 stools in a 24-hour period by age 76 days. The stool should be soft and yellow. ? At least 3 stools in a 24-hour period by age 76 days. The stool should be seedy and yellow. ? No loss of weight greater than 10%  of birth weight during the first 23 days of age. ? Average weight gain of 4-7 ounces (113-198  g) per week after age 44 days. ? Consistent daily weight gain by age 21 days, without weight loss after the age of 2 weeks. ? By day 6, expect your baby to have 6-8 wet diapers and 3 stools per day.  Check your baby's weight at 1 week of age to make sure he or she is growing and gaining weight.  If your baby does not get enough milk from breastfeeding alone, it may be possible to breastfeed part of the time. You can supplement feedings with donated milk or formula. Discuss your options with a lactation specialist.  When should you seek medical care?  If you have a hard, sore area in your breast.  If you have flu-like symptoms. This information is not intended to replace advice given to you by your health care provider. Make sure you discuss any questions you have with your health care provider. Document Released: 05/31/2005 Document Revised: 11/06/2015 Document Reviewed: 03/23/2013 Elsevier Interactive Patient Education  2017 [employer] Vaginal Birth After Cesarean Delivery Vaginal birth after cesarean delivery (VBAC) is giving birth vaginally after previously delivering a baby by a cesarean. In the past, if a woman had a cesarean delivery, all births afterward would be done by cesarean delivery. This is no longer true. It can be safe for the mother to try a vaginal delivery after having a cesarean delivery. It is important to discuss VBAC with your health care provider early in the pregnancy so you can understand the risks, benefits, and options. It will give you time to decide what is best in your particular case. The final decision about whether to have a VBAC or repeat cesarean delivery should be between you and your health care provider. Any changes in your health or your baby's health during your pregnancy may make it necessary to change your initial decision about VBAC. Women who plan to have a VBAC should check with their health care provider to be sure that:  The previous cesarean  delivery was done with a low transverse uterine cut (incision) (not a vertical classical incision).  The birth canal is big enough for the baby.  There were no other operations on the uterus.  An electronic fetal monitor (EFM) will be on at all times during labor.  An operating room will be available and ready in case an emergency cesarean delivery is needed.  A health care provider and surgical nursing staff will be available at all times during labor to be ready to do an emergency delivery cesarean if necessary.  An anesthesiologist will be present in case an emergency cesarean delivery is needed.  The nursery is prepared and has adequate personnel and necessary equipment available to care for the baby in case of an emergency cesarean delivery. Benefits of VBAC  Shorter stay in the hospital.  Avoidance of risks associated with cesarean delivery, such as: ? Surgical complications, such as opening of the incision or hernia in the incision. ? Injury to other organs. ? Fever. This can occur if an infection develops after surgery. It can also occur as a reaction to the medicine given to make you numb during the surgery.  Less blood loss and need for blood transfusions.  Lower risk of blood clots and infection.  Shorter recovery.  Decreased risk for having to remove the uterus (hysterectomy).  Decreased risk for the placenta to completely or partially cover  the opening of the uterus (placenta previa) with a future pregnancy.  Decrease risk in future labor and delivery. Risks of a VBAC  Tearing (rupture) of the uterus. This is occurs in less than 1% of VBACs. The risk of this happening is higher if: ? Steps are taken to begin the labor process (induce labor) or stimulate or strengthen contractions (augment labor). ? Medicine is used to soften (ripen) the cervix.  Having to remove the uterus (hysterectomy) if it ruptures. VBAC should not be done if:  The previous cesarean  delivery was done with a vertical (classical) or T-shaped incision or you do not know what kind of incision was made.  You had a ruptured uterus.  You have had certain types of surgery on your uterus, such as removal of uterine fibroids. Ask your health care provider about other types of surgeries that prevent you from having a VBAC.  You have certain medical or childbirth (obstetrical) problems.  There are problems with the baby.  You have had two previous cesarean deliveries and no vaginal deliveries. Other facts to know about VBAC:  It is safe to have an epidural anesthetic with VBAC.  It is safe to turn the baby from a breech position (attempt an external cephalic version).  It is safe to try a VBAC with twins.  VBAC may not be successful if your baby weights 8.8 lb (4 kg) or more. However, weight predictions are not always accurate and should not be used alone to decide if VBAC is right for you.  There is an increased failure rate if the time between the cesarean delivery and VBAC is less than 19 months.  Your health care provider may advise against a VBAC if you have preeclampsia (high blood pressure, protein in the urine, and swelling of face and extremities).  VBAC is often successful if you previously gave birth vaginally.  VBAC is often successful when the labor starts spontaneously before the due date.  Delivering a baby through a VBAC is similar to having a normal spontaneous vaginal delivery. This information is not intended to replace advice given to you by your health care provider. Make sure you discuss any questions you have with your health care provider. Document Released: 11/21/2006 Document Revised: 11/06/2015 Document Reviewed: 12/28/2012 Elsevier Interactive Patient Education  [employer].

## 2017-08-11 NOTE — Progress Notes (Signed)
ROB pt is having heartburn and the zantac is not helping at all.

## 2017-08-11 NOTE — Progress Notes (Signed)
ROB: Patient is present from midwifery service, for prior C-section x 1 (for fetal distress), desiring TOLAC. Also had a h/o GHTN and IUGR in prior pregnancy (requiring IOL at ~36 weeks).   She is complaining today of worsening heartburn despite use of Zantac.  Will prescribe Protonix. Desires Depo Provera for contraception, desires to bottle feed (h/o breast reduction).    The following risks were discussed with the patient.  Risk of uterine rupture at term is 0.78 percent with TOLAC and 0.22 percent with ERCD. 1 in 10 uterine ruptures will result in neonatal death or neurological injury. The benefits of a trial of labor after cesarean (TOLAC) resulting in a vaginal birth after cesarean (VBAC) include the following: shorter length of hospital stay and postpartum recovery (in most cases); fewer complications, such as postpartum fever, wound or uterine infection, thromboembolism (blood clots in the leg or lung), need for blood transfusion and fewer neonatal breathing problems. The risks of an attempted VBAC or TOLAC include the following: Risk of failed trial of labor after cesarean (TOLAC) without a vaginal birth after cesarean (VBAC) resulting in repeat cesarean delivery (RCD) in about 20 to 40 percent of women who attempt VBAC.  Risk of rupture of uterus resulting in an emergency cesarean delivery. The risk of uterine rupture may be related in part to the type of uterine incision made during the first cesarean delivery. A previous transverse uterine incision has the lowest risk of rupture (0.2 to 1.5 percent risk). Vertical or T-shaped uterine incisions have a higher risk of uterine rupture (4 to 9 percent risk)The risk of fetal death is very low with both VBAC and elective repeat cesarean delivery (ERCD), but the likelihood of fetal death is higher with VBAC than with ERCD. Maternal death is very rare with either type of delivery. The risks of an elective repeat cesarean delivery (ERCD) were reviewed  with the patient including but not limited to: 07/998 risk of uterine rupture which could have serious consequences, bleeding which may require transfusion; infection which may require antibiotics; injury to bowel, bladder or other surrounding organs (bowel, bladder, ureters); injury to the fetus; need for additional procedures including hysterectomy in the event of a life-threatening hemorrhage; thromboembolic phenomenon; abnormal placentation; incisional problems; death and other postoperative or anesthesia complications.    VBAC calculated score is 53.9% (CI 50.6%-57.1%).  All her questions were answered.  Will continue routine postpartum care with [facility].    Hildred Laser, MD [facility]

## 2017-08-25 ENCOUNTER — Encounter: Payer: Self-pay | Admitting: Certified Nurse Midwife

## 2017-08-26 ENCOUNTER — Encounter: Payer: Medicaid Other | Admitting: Certified Nurse Midwife

## 2017-08-29 ENCOUNTER — Ambulatory Visit (INDEPENDENT_AMBULATORY_CARE_PROVIDER_SITE_OTHER): Payer: Medicaid Other | Admitting: Certified Nurse Midwife

## 2017-08-29 ENCOUNTER — Encounter: Payer: Self-pay | Admitting: Obstetrics and Gynecology

## 2017-08-29 VITALS — BP 103/69 | HR 79 | Wt 177.2 lb

## 2017-08-29 DIAGNOSIS — Z3483 Encounter for supervision of other normal pregnancy, third trimester: Secondary | ICD-10-CM | POA: Diagnosis not present

## 2017-08-29 LAB — POCT URINALYSIS DIPSTICK
Bilirubin, UA: NEGATIVE
Glucose, UA: NEGATIVE
Ketones, UA: NEGATIVE
NITRITE UA: NEGATIVE
PROTEIN UA: NEGATIVE
RBC UA: NEGATIVE
SPEC GRAV UA: 1.01 (ref 1.010–1.025)
Urobilinogen, UA: 0.2 E.U./dL
pH, UA: 7 (ref 5.0–8.0)

## 2017-08-29 NOTE — Patient Instructions (Signed)
Trial of Labor After Cesarean Delivery A trial of labor after cesarean delivery (TOLAC) is when a woman tries to give birth vaginally after a previous cesarean delivery. TOLAC may be a safe and appropriate option for you depending on your medical history and other risk factors. When TOLAC is successful and you are able to have a vaginal delivery, this is called a vaginal birth after cesarean delivery (VBAC). Candidates for TOLAC TOLAC is possible for some women who:  Have undergone one or two prior cesarean deliveries in which the incision of the uterus was horizontal (low transverse).  Are carrying twins and have had one prior low transverse incision during a cesarean delivery.  Do not have a vertical (classical) uterine scar.  Have not had a tear in the wall of their uterus (uterine rupture).  TOLAC is also supported for women who meet appropriate criteria and:  Are under the age of 40 years.  Are tall and have a body mass index (BMI) of less than 30.  Have an unknown uterine scar.  Give birth in a facility equipped to handle an emergency cesarean delivery. This team should be able to handle possible complications such as a uterine rupture.  Have thorough counseling about the benefits and risks of TOLAC.  Have discussed future pregnancy plans with their health care provider.  Plan to have several more pregnancies.  Most successful candidates for TOLAC:  Have had a successful vaginal delivery before or after their cesarean delivery.  Experience labor that begins naturally on or before the due date (40 weeks of gestation).  Do not have a very large (macrosomic) baby.  Had a prior cesarean delivery but are not currently experiencing factors that would prompt a cesarean delivery (such as a breech position).  Had only one prior cesarean delivery.  Had a prior cesarean delivery that was performed early in labor and not after full cervical dilation. TOLAC may be most appropriate  for women who meet the above guidelines and who plan to have more pregnancies. TOLAC is not recommended for home births. Least successful candidates for TOLAC:  Have an induced labor with an unfavorable cervix. An unfavorable cervix is when the cervix is not dilating enough (among other factors).  Have never had a vaginal delivery.  Have had more than two cesarean deliveries.  Have a pregnancy at more than 40 weeks of gestation.  Are pregnant with a baby with a suspected weight greater than 4,000 grams (8 pounds) and who have no prior history of a vaginal delivery.  Have closely spaced pregnancies. Suggested benefits of TOLAC  You may have a faster recovery time.  You may have a shorter stay in the hospital.  You may have less pain and fewer problems than with a cesarean delivery. Women who have a cesarean delivery have a higher chance of needing blood or getting a fever, an infection, or a blood clot in the legs. Suggested risks of TOLAC The highest risk of complications happens to women who attempt a TOLAC and fail. A failed TOLAC results in an unplanned cesarean delivery. Risks related to TOLAC or repeat cesarean deliveries include:  Blood loss.  Infection.  Blood clot.  Injury to surrounding tissues or organs.  Having to remove the uterus (hysterectomy).  Potential problems with the placenta (such as placenta previa or placenta accreta) in future pregnancies.  Although very rare, the main concerns with TOLAC are:  Rupture of the uterine scar from a past cesarean delivery.  Needing an emergency   cesarean delivery.  Having a bad outcome for the baby (perinatal morbidity).  Where to find more information:  American Congress of Obstetricians and Gynecologists: www.acog.org  American College of Nurse-Midwives: www.midwife.org This information is not intended to replace advice given to you by your health care provider. Make sure you discuss any questions you have with  your health care provider. Document Released: 02/16/2011 Document Revised: 04/28/2016 Document Reviewed: 11/20/2012 Elsevier Interactive Patient Education  2018 Elsevier Inc.  

## 2017-08-29 NOTE — Progress Notes (Addendum)
ROB-Report intermittent pelvic and vaginal pain with increased watery discharge. Intermittent insomnia noted.

## 2017-08-29 NOTE — Progress Notes (Signed)
ROB-Doing well. Anticipatory guidance regarding course of prenatal care including 36 week cultures. Reviewed red flag symptoms and when to call. RTC x 2 weeks for 36 week cultures and ROB or sooner if needed.

## 2017-09-11 ENCOUNTER — Other Ambulatory Visit: Payer: Self-pay

## 2017-09-11 ENCOUNTER — Encounter: Payer: Self-pay | Admitting: *Deleted

## 2017-09-11 ENCOUNTER — Observation Stay
Admission: EM | Admit: 2017-09-11 | Discharge: 2017-09-11 | Disposition: A | Discharge status: Home / Self Care | Payer: Medicaid Other | Attending: Obstetrics and Gynecology | Admitting: Obstetrics and Gynecology

## 2017-09-11 DIAGNOSIS — Z3A36 36 weeks gestation of pregnancy: Secondary | ICD-10-CM

## 2017-09-11 DIAGNOSIS — O219 Vomiting of pregnancy, unspecified: Secondary | ICD-10-CM

## 2017-09-11 DIAGNOSIS — O4703 False labor before 37 completed weeks of gestation, third trimester: Principal | ICD-10-CM | POA: Insufficient documentation

## 2017-09-11 LAB — URINALYSIS, ROUTINE W REFLEX MICROSCOPIC
BILIRUBIN URINE: NEGATIVE
Glucose, UA: NEGATIVE mg/dL
Ketones, ur: NEGATIVE mg/dL
NITRITE: NEGATIVE
Protein, ur: NEGATIVE mg/dL
SPECIFIC GRAVITY, URINE: 1.011 (ref 1.005–1.030)
pH: 7 (ref 5.0–8.0)

## 2017-09-11 MED ORDER — TERBUTALINE SULFATE 1 MG/ML IJ SOLN
0.2500 mg | Freq: Once | INTRAMUSCULAR | Status: AC
  Administered 2017-09-11: 0.25 mg via SUBCUTANEOUS
  Filled 2017-09-11: qty 1

## 2017-09-11 MED ORDER — LACTATED RINGERS IV BOLUS
1000.0000 mL | Freq: Once | INTRAVENOUS | Status: AC
  Administered 2017-09-11: 1000 mL via INTRAVENOUS

## 2017-09-11 NOTE — OB Triage Note (Signed)
L&D OB Triage Note  SUBJECTIVE Elizabeth Frey is a 27 y.o. [phone] female at [redacted]w[redacted]d, EDD Estimated Date of Delivery: 10/08/17 who presented to triage with complaints of contractions more than 6 hour that have gotten worse through the day. She feels good fetal movement and denies LOF.   OB History  Gravida Para Term Preterm AB Living  4 2 0 1 1 1   SAB TAB Ectopic Multiple Live Births  1 0 0 0 1    # Outcome Date GA Lbr Len/2nd Weight Sex Delivery Anes PTL Lv  4 Current           3 Para 2014    F CS-Unspec     2 SAB           1 Preterm             Medications Prior to Admission  Medication Sig Dispense Refill Last Dose  . acetaminophen (TYLENOL) 500 MG tablet Take 500 mg every 6 (six) hours as needed by mouth for moderate pain or headache.   09/10/2017 at Unknown time  . diphenhydrAMINE (BENADRYL) 25 MG tablet Take 25 mg by mouth every 6 (six) hours as needed.   Past Month at Unknown time  . pantoprazole (PROTONIX) 20 MG tablet Take 1 tablet (20 mg total) by mouth 2 (two) times daily before a meal. 60 tablet 2 09/10/2017 at Unknown time  . Prenatal Vit-Fe Fumarate-FA (MULTIVITAMIN-PRENATAL) 27-0.8 MG TABS tablet Take 1 tablet by mouth daily at 12 noon.   09/11/2017 at Unknown time     OBJECTIVE  Nursing Evaluation:   BP 122/80 (BP Location: Left Arm)   Pulse 71   Temp 98.6 F (37 C) (Oral)   Resp 16   Ht 5\' 3"  (1.6 m)   Wt 178 lb (80.7 kg)   LMP 01/06/2017   BMI 31.53 kg/m    Findings:   Irregular contractions  NST was performed and has been reviewed by me.  NST INTERPRETATION: Category I  Mode: External Baseline Rate (A): 130 bpm Variability: Moderate Accelerations: 15 x 15 Decelerations: None     Contraction Frequency (min): ctx with ui present  ASSESSMENT Impression:  1.  Pregnancy:  [id number] at [redacted]w[redacted]d , EDD Estimated Date of Delivery: 10/08/17 2.  NST:  Category I  3.Irregular contractions no cervical change 4. IV hydration, terbutaline x 1 , U/a , urine  culture sent. Pt states she feels much better.  PLAN 1. Reassurance given, will follow up with urine culture results 2. Discharge home with standard labor precautions given to return to L&D or call the office for problems. 3. Continue routine prenatal care.    Doreene Burke, CNM

## 2017-09-11 NOTE — OB Triage Note (Signed)
Started feeling menstrual like cramps @ 1015 this am then worsening. Denies LOF, bleeding. Reports good fetal movement. Elaina Hoops

## 2017-09-13 LAB — URINE CULTURE

## 2017-09-16 ENCOUNTER — Encounter: Payer: Medicaid Other | Admitting: Obstetrics and Gynecology

## 2017-09-16 ENCOUNTER — Ambulatory Visit (INDEPENDENT_AMBULATORY_CARE_PROVIDER_SITE_OTHER): Payer: Medicaid Other | Admitting: Obstetrics and Gynecology

## 2017-09-16 VITALS — BP 127/71 | HR 95 | Wt 178.7 lb

## 2017-09-16 DIAGNOSIS — Z3493 Encounter for supervision of normal pregnancy, unspecified, third trimester: Secondary | ICD-10-CM

## 2017-09-16 DIAGNOSIS — Z113 Encounter for screening for infections with a predominantly sexual mode of transmission: Secondary | ICD-10-CM

## 2017-09-16 DIAGNOSIS — Z3685 Encounter for antenatal screening for Streptococcus B: Secondary | ICD-10-CM

## 2017-09-16 LAB — POCT URINALYSIS DIPSTICK
Bilirubin, UA: NEGATIVE
Blood, UA: NEGATIVE
Glucose, UA: NEGATIVE
KETONES UA: 15
Nitrite, UA: NEGATIVE
Protein, UA: NEGATIVE
Spec Grav, UA: 1.01 (ref 1.010–1.025)
Urobilinogen, UA: 0.2 E.U./dL
pH, UA: 6 (ref 5.0–8.0)

## 2017-09-16 NOTE — Progress Notes (Signed)
ROB- cultures obtained, pt is having a lot of pelvic pressure 

## 2017-09-16 NOTE — Progress Notes (Signed)
ROB-doing well, labor precautions discussed. Cultures obtained

## 2017-09-18 LAB — STREP GP B NAA: STREP GROUP B AG: NEGATIVE

## 2017-09-19 LAB — GC/CHLAMYDIA PROBE AMP
CHLAMYDIA, DNA PROBE: NEGATIVE
NEISSERIA GONORRHOEAE BY PCR: NEGATIVE

## 2017-09-23 ENCOUNTER — Ambulatory Visit (INDEPENDENT_AMBULATORY_CARE_PROVIDER_SITE_OTHER): Payer: Medicaid Other | Admitting: Obstetrics and Gynecology

## 2017-09-23 VITALS — BP 135/80 | HR 88 | Wt 180.7 lb

## 2017-09-23 DIAGNOSIS — Z3493 Encounter for supervision of normal pregnancy, unspecified, third trimester: Secondary | ICD-10-CM | POA: Diagnosis not present

## 2017-09-23 LAB — POCT URINALYSIS DIPSTICK
Bilirubin, UA: NEGATIVE
Glucose, UA: NEGATIVE
Ketones, UA: NEGATIVE
NITRITE UA: NEGATIVE
PH UA: 7 (ref 5.0–8.0)
PROTEIN UA: NEGATIVE
Spec Grav, UA: 1.015 (ref 1.010–1.025)
UROBILINOGEN UA: 0.2 U/dL

## 2017-09-23 NOTE — Progress Notes (Signed)
ROB- pt is here with contractions q 8 min apart, having a lot of pelvic pressure

## 2017-09-23 NOTE — Progress Notes (Signed)
ROB- reports increasing contractions all day, now 8 minutes apart, mild to palpation. Reassured and labor precautions discussed.

## 2017-09-24 ENCOUNTER — Observation Stay
Admission: EM | Admit: 2017-09-24 | Discharge: 2017-09-24 | Disposition: A | Discharge status: Home / Self Care | Payer: Medicaid Other | Source: Home / Self Care | Admitting: Obstetrics and Gynecology

## 2017-09-24 ENCOUNTER — Other Ambulatory Visit: Payer: Self-pay

## 2017-09-24 ENCOUNTER — Inpatient Hospital Stay
Admission: EM | Admit: 2017-09-24 | Discharge: 2017-09-26 | DRG: 807 | Disposition: A | Discharge status: Home / Self Care | Payer: Medicaid Other | Attending: Obstetrics and Gynecology | Admitting: Obstetrics and Gynecology

## 2017-09-24 ENCOUNTER — Encounter: Payer: Self-pay | Admitting: *Deleted

## 2017-09-24 DIAGNOSIS — O34211 Maternal care for low transverse scar from previous cesarean delivery: Secondary | ICD-10-CM

## 2017-09-24 DIAGNOSIS — O34219 Maternal care for unspecified type scar from previous cesarean delivery: Secondary | ICD-10-CM | POA: Diagnosis present

## 2017-09-24 DIAGNOSIS — O219 Vomiting of pregnancy, unspecified: Secondary | ICD-10-CM

## 2017-09-24 DIAGNOSIS — Z3A38 38 weeks gestation of pregnancy: Secondary | ICD-10-CM

## 2017-09-24 DIAGNOSIS — Z3483 Encounter for supervision of other normal pregnancy, third trimester: Secondary | ICD-10-CM | POA: Diagnosis present

## 2017-09-24 DIAGNOSIS — Z3490 Encounter for supervision of normal pregnancy, unspecified, unspecified trimester: Secondary | ICD-10-CM

## 2017-09-24 LAB — CBC
HEMATOCRIT: 33.6 % — AB (ref 35.0–47.0)
Hemoglobin: 11.3 g/dL — ABNORMAL LOW (ref 12.0–16.0)
MCH: 29.2 pg (ref 26.0–34.0)
MCHC: 33.6 g/dL (ref 32.0–36.0)
MCV: 86.8 fL (ref 80.0–100.0)
Platelets: 187 10*3/uL (ref 150–440)
RBC: 3.87 MIL/uL (ref 3.80–5.20)
RDW: 14.8 % — ABNORMAL HIGH (ref 11.5–14.5)
WBC: 8.2 10*3/uL (ref 3.6–11.0)

## 2017-09-24 LAB — URINE DRUG SCREEN, QUALITATIVE (ARMC ONLY)
AMPHETAMINES, UR SCREEN: NOT DETECTED
Barbiturates, Ur Screen: NOT DETECTED
Benzodiazepine, Ur Scrn: NOT DETECTED
Cannabinoid 50 Ng, Ur ~~LOC~~: NOT DETECTED
Cocaine Metabolite,Ur ~~LOC~~: NOT DETECTED
MDMA (Ecstasy)Ur Screen: NOT DETECTED
Methadone Scn, Ur: NOT DETECTED
Opiate, Ur Screen: NOT DETECTED
PHENCYCLIDINE (PCP) UR S: NOT DETECTED
Tricyclic, Ur Screen: NOT DETECTED

## 2017-09-24 LAB — TYPE AND SCREEN
ABO/RH(D): O POS
Antibody Screen: NEGATIVE

## 2017-09-24 MED ORDER — PRENATAL MULTIVITAMIN CH
1.0000 | ORAL_TABLET | Freq: Every day | ORAL | Status: DC
  Administered 2017-09-25: 1 via ORAL
  Filled 2017-09-24: qty 1

## 2017-09-24 MED ORDER — DIBUCAINE 1 % RE OINT: 1.0000 "application " | TOPICAL_OINTMENT | RECTAL | Status: DC | PRN

## 2017-09-24 MED ORDER — BENZOCAINE-MENTHOL 20-0.5 % EX AERO
1.0000 "application " | INHALATION_SPRAY | CUTANEOUS | Status: DC | PRN
  Administered 2017-09-25: 1 via TOPICAL
  Filled 2017-09-24 (×3): qty 56

## 2017-09-24 MED ORDER — LIDOCAINE HCL (PF) 1 % IJ SOLN
30.0000 mL | INTRAMUSCULAR | Status: DC | PRN
  Administered 2017-09-24: 30 mL via SUBCUTANEOUS

## 2017-09-24 MED ORDER — ACETAMINOPHEN 500 MG PO TABS
1000.0000 mg | ORAL_TABLET | Freq: Once | ORAL | Status: AC
  Administered 2017-09-24: 1000 mg via ORAL
  Filled 2017-09-24: qty 2

## 2017-09-24 MED ORDER — ONDANSETRON HCL 4 MG/2ML IJ SOLN: 4.0000 mg | Freq: Four times a day (QID) | INTRAMUSCULAR | Status: DC | PRN

## 2017-09-24 MED ORDER — OXYTOCIN 40 UNITS IN LACTATED RINGERS INFUSION - SIMPLE MED
INTRAVENOUS | Status: AC
  Filled 2017-09-24: qty 1000

## 2017-09-24 MED ORDER — SOD CITRATE-CITRIC ACID 500-334 MG/5ML PO SOLN: 30.0000 mL | ORAL | Status: DC | PRN

## 2017-09-24 MED ORDER — LACTATED RINGERS IV SOLN: INTRAVENOUS | Status: DC

## 2017-09-24 MED ORDER — IBUPROFEN 600 MG PO TABS
600.0000 mg | ORAL_TABLET | Freq: Four times a day (QID) | ORAL | Status: DC
  Administered 2017-09-24 – 2017-09-26 (×7): 600 mg via ORAL
  Filled 2017-09-24 (×7): qty 1

## 2017-09-24 MED ORDER — OXYTOCIN 10 UNIT/ML IJ SOLN
INTRAMUSCULAR | Status: AC
  Filled 2017-09-24: qty 2

## 2017-09-24 MED ORDER — ONDANSETRON HCL 4 MG PO TABS: 4.0000 mg | ORAL_TABLET | ORAL | Status: DC | PRN

## 2017-09-24 MED ORDER — MISOPROSTOL 200 MCG PO TABS
ORAL_TABLET | ORAL | Status: AC
  Filled 2017-09-24: qty 4

## 2017-09-24 MED ORDER — BUTORPHANOL TARTRATE 2 MG/ML IJ SOLN: 2.0000 mg | Freq: Once | INTRAMUSCULAR | Status: DC | PRN

## 2017-09-24 MED ORDER — OXYTOCIN 40 UNITS IN LACTATED RINGERS INFUSION - SIMPLE MED: 2.5000 [IU]/h | INTRAVENOUS | Status: DC

## 2017-09-24 MED ORDER — ALUM & MAG HYDROXIDE-SIMETH 200-200-20 MG/5ML PO SUSP
30.0000 mL | Freq: Four times a day (QID) | ORAL | Status: DC | PRN
  Administered 2017-09-24: 30 mL via ORAL
  Filled 2017-09-24: qty 30

## 2017-09-24 MED ORDER — SIMETHICONE 80 MG PO CHEW: 80.0000 mg | CHEWABLE_TABLET | ORAL | Status: DC | PRN

## 2017-09-24 MED ORDER — SENNOSIDES-DOCUSATE SODIUM 8.6-50 MG PO TABS
2.0000 | ORAL_TABLET | ORAL | Status: DC
  Administered 2017-09-25: 2 via ORAL
  Filled 2017-09-24 (×3): qty 2

## 2017-09-24 MED ORDER — ACETAMINOPHEN 325 MG PO TABS
650.0000 mg | ORAL_TABLET | ORAL | Status: DC | PRN
  Administered 2017-09-24 – 2017-09-26 (×5): 650 mg via ORAL
  Filled 2017-09-24 (×5): qty 2

## 2017-09-24 MED ORDER — ACETAMINOPHEN 325 MG PO TABS: 650.0000 mg | ORAL_TABLET | ORAL | Status: DC | PRN

## 2017-09-24 MED ORDER — ZOLPIDEM TARTRATE 5 MG PO TABS: 5.0000 mg | ORAL_TABLET | Freq: Every evening | ORAL | Status: DC | PRN

## 2017-09-24 MED ORDER — ONDANSETRON HCL 4 MG/2ML IJ SOLN: 4.0000 mg | INTRAMUSCULAR | Status: DC | PRN

## 2017-09-24 MED ORDER — WITCH HAZEL-GLYCERIN EX PADS: 1.0000 "application " | MEDICATED_PAD | CUTANEOUS | Status: DC | PRN

## 2017-09-24 MED ORDER — BUTORPHANOL TARTRATE 2 MG/ML IJ SOLN
2.0000 mg | Freq: Once | INTRAMUSCULAR | Status: AC | PRN
  Administered 2017-09-24: 2 mg via INTRAMUSCULAR
  Filled 2017-09-24: qty 1

## 2017-09-24 MED ORDER — FENTANYL CITRATE (PF) 100 MCG/2ML IJ SOLN: 50.0000 ug | INTRAMUSCULAR | Status: DC | PRN

## 2017-09-24 MED ORDER — AMMONIA AROMATIC IN INHA
RESPIRATORY_TRACT | Status: AC
  Filled 2017-09-24: qty 10

## 2017-09-24 MED ORDER — LACTATED RINGERS IV SOLN: 500.0000 mL | INTRAVENOUS | Status: DC | PRN

## 2017-09-24 MED ORDER — OXYTOCIN BOLUS FROM INFUSION
500.0000 mL | Freq: Once | INTRAVENOUS | Status: AC
  Administered 2017-09-24: 500 mL via INTRAVENOUS

## 2017-09-24 MED ORDER — LIDOCAINE HCL (PF) 1 % IJ SOLN
INTRAMUSCULAR | Status: AC
  Filled 2017-09-24: qty 30

## 2017-09-24 NOTE — OB Triage Note (Signed)
Patient reports to triage with c/o contractions "all day" increasing in frequency around 2200/2300. Reports good fetal movement. Denies leaking of fluid or vaginal bleeding. EFM applied and assessing.

## 2017-09-24 NOTE — Discharge Instructions (Signed)
Please keep follow up appointment at [facility].  If you have questions or concerns please call the on call provider.  You may also call the nurse's desk at the [facility] at [facility] [phone] for questions. You may take extra strength tylenol for pain.  You may also take Tylenol PM for sleep.  Make sure not to exceed the maximum dose of 4 grams Acetaminophen in 24 hours.  If you have urgent concerns please go to the nearest emergency department for evaluation.

## 2017-09-24 NOTE — H&P (Signed)
Obstetric History and Physical  Elizabeth Frey is a 27 y.o. [phone] with IUP at [redacted]w[redacted]d presenting with SROM and uterine contractions. Patient states she has been having  irregular, every 4-6 minutes contractions, moderate vaginal bleeding, ruptured, clear fluid membranes, with active fetal movement.    Prenatal Course Source of Care: [facility]  Pregnancy complications or risks:prev. C/s for fetal distress, MJ+ use  Prenatal labs and studies: ABO, Rh: O/Positive/-- (10/08 1131) Antibody: Negative (10/08 1131) Rubella: 7.63 (10/08 1131) RPR: Non Reactive (02/01 1032)  HBsAg: Negative (10/08 1131)  HIV: Non Reactive (10/08 1131)  AVW:UJWJXBJY (04/05 1625) 1 hr Glucola  normal Genetic screening normal Anatomy US normal  Past Medical History:  Diagnosis Date  . Gallstones   . Headache(784.0)    non-migraine; every 1-2 days  . 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 BREAST REDUCTION SURGERY  08/24/2011   Procedure: MAMMARY REDUCTION BILATERAL (BREAST);  Surgeon: Karie Fetch, MD;  Location: [facility];  Service: Plastics;  Laterality: Bilateral;  . BREAST SURGERY    . CESAREAN SECTION    . DILATION AND CURETTAGE OF UTERUS  2008  . WISDOM TOOTH EXTRACTION      OB History  Gravida Para Term Preterm AB Living  3 1   1 1 1   SAB TAB Ectopic Multiple Live Births  1       1    # Outcome Date GA Lbr Len/2nd Weight Sex Delivery Anes PTL Lv  3 Current           2 Preterm 2014    F CS-Unspec   LIV  1 SAB             Social History   Socioeconomic History  . Marital status: Single    Spouse name: Not on file  . Number of children: Not on file  . Years of education: Not on file  . Highest education level: Not on file  Occupational History  . Not on file  Social Needs  . Financial resource strain: Not on file  . Food insecurity:    Worry: Not on file    Inability: Not on file  . Transportation needs:    Medical: Not  on file    Non-medical: Not on file  Tobacco Use  . Smoking status: Never Smoker  . Smokeless tobacco: Never Used  Substance and Sexual Activity  . Alcohol use: No  . Drug use: No  . Sexual activity: Yes    Birth control/protection: Other-see comments    Comment: undecided  Lifestyle  . Physical activity:    Days per week: Not on file    Minutes per session: Not on file  . Stress: Not on file  Relationships  . Social connections:    Talks on phone: Not on file    Gets together: Not on file    Attends religious service: Not on file    Active member of club or organization: Not on file    Attends meetings of clubs or organizations: Not on file    Relationship status: Not on file  Other Topics Concern  . Not on file  Social History Narrative  . Not on file    History reviewed. No pertinent family history.  Medications Prior to Admission  Medication Sig Dispense Refill Last Dose  . acetaminophen (TYLENOL) 500 MG tablet Take 500 mg every 6 (six) hours as needed by mouth for moderate pain  or headache.   Not Taking at Unknown time  . diphenhydrAMINE (BENADRYL) 25 MG tablet Take 25 mg by mouth every 6 (six) hours as needed.   Not Taking at Unknown time  . pantoprazole (PROTONIX) 20 MG tablet Take 1 tablet (20 mg total) by mouth 2 (two) times daily before a meal. (Patient not taking: Reported on 09/24/2017) 60 tablet 2 Not Taking at Unknown time  . Prenatal Vit-Fe Fumarate-FA (MULTIVITAMIN-PRENATAL) 27-0.8 MG TABS tablet Take 1 tablet by mouth daily at 12 noon.   09/23/2017 at Unknown time    No Known Allergies  Review of Systems: Negative except for what is mentioned in HPI.  Physical Exam: BP (!) 125/101   Pulse (!) 101   Temp 97.7 F (36.5 C) (Oral)   Resp 16   Ht 5\' 3"  (1.6 m)   Wt 180 lb (81.6 kg)   LMP 01/06/2017   BMI 31.89 kg/m  GENERAL: Well-developed, well-nourished female in no acute distress.  LUNGS: Clear to auscultation bilaterally.  HEART: Regular rate and  rhythm. ABDOMEN: Soft, nontender, nondistended, gravid. EXTREMITIES: Nontender, no edema, 2+ distal pulses. Cervical Exam: Dilation: 9 Effacement (%): 100 Station: Plus 1 Presentation: Vertex Exam by:: MS CNM FHT:  Baseline rate 148 bpm   Variability moderate  Accelerations present   Decelerations variable Contractions: Every 5 mins   Pertinent Labs/Studies:   No results found for this or any previous visit (from the past 24 hour(s)).  Assessment : Elizabeth Frey is a 27 y.o. [id number] at [redacted]w[redacted]d being admitted for labor.  Plan: Labor: Expectant management. VBAC protocol instituted, MAD on unit. FWB: Reassuring fetal heart tracing.  GBS negative Delivery plan: Hopeful for vaginal delivery  Kache Mcclurg, CNM [facility], [facility]

## 2017-09-25 LAB — CBC
HCT: 30.7 % — ABNORMAL LOW (ref 35.0–47.0)
HEMOGLOBIN: 10.3 g/dL — AB (ref 12.0–16.0)
MCH: 29.3 pg (ref 26.0–34.0)
MCHC: 33.6 g/dL (ref 32.0–36.0)
MCV: 87.3 fL (ref 80.0–100.0)
Platelets: 164 10*3/uL (ref 150–440)
RBC: 3.52 MIL/uL — AB (ref 3.80–5.20)
RDW: 14.5 % (ref 11.5–14.5)
WBC: 12.8 10*3/uL — ABNORMAL HIGH (ref 3.6–11.0)

## 2017-09-25 LAB — URINE CULTURE

## 2017-09-25 MED ORDER — DIPHENHYDRAMINE HCL 25 MG PO CAPS: 25.0000 mg | ORAL_CAPSULE | Freq: Four times a day (QID) | ORAL | Status: DC | PRN

## 2017-09-25 MED ORDER — COCONUT OIL OIL: 1.0000 "application " | TOPICAL_OIL | Status: DC | PRN

## 2017-09-25 NOTE — Clinical Social Work Note (Addendum)
CSW received consult that the patient's infants have presented as positive for cannabinoids. CSW will assess when able.  UPDATE: This consult read that the patient's infant was exposed and UDS is currently negative with cord blood pending. CSW will assess.  Argentina Ponder, MSW, Theresia Majors [phone]

## 2017-09-25 NOTE — Clinical Social Work Maternal (Signed)
  CLINICAL SOCIAL WORK MATERNAL/CHILD NOTE  Patient Details  Name: JOHARI BENNETTS MRN: [id number] Date of Birth: [birth year]  Date:  09/25/2017  Clinical Social Worker Initiating Note:  Santiago Bumpers, MSW, [place] Date/Time: Initiated:  09/25/17/1132     Child's Name:  Joneen Caraway   Biological Parents:  Mother, Father   Need for Interpreter:  None   Reason for Referral:  Current Substance Use/Substance Use During Pregnancy    Address:  [place] [address]    Phone number:  [phone] (home)     Additional phone number: None  Household Members/Support Persons (HM/SP):   Household Member/Support Person 1   HM/SP Name Relationship DOB or Age  HM/SP -[place] child 3  HM/SP -2        HM/SP -3        HM/SP -4        HM/SP -5        HM/SP -6        HM/SP -7        HM/SP -8          Natural Supports (not living in the home):  Parent, Spouse/significant other, Radiographer, therapeutic Supports:     Employment: Part-time   Type of Work:     Education:  Programmer, systems   Homebound arranged:    Museum/gallery curator Resources:  Medicaid   Other Resources:  Physicist, medical , [place], Child Support   Cultural/Religious Considerations Which May Impact Care:  None noted  Strengths:  Ability to meet basic needs , Compliance with medical plan , Home prepared for child , Pediatrician chosen   Psychotropic Medications:         Pediatrician:    [employer]  Pediatrician List:   [place]      Pediatrician Fax Number:    Risk Factors/Current Problems:  None   Cognitive State:  Goal Oriented , Insightful , Linear Thinking    Mood/Affect:  Happy    CSW Assessment: The CSW met with the patient, her mother, and the FOB at bedside. The patient stated that she was comfortable with her family remaining.  The CSW introduced self and reason for visit. The patient stated that she has not used marijuana since finding out that she was pregnant, and she is aware of her positive UDS in 03/2017 for cannabinoids. The CSW explained the cord blood monitoring process and CPS referral should the cord blood show positivity. The patient verbalized understanding. The patient was appropriate with her infant during the assessment, has [place], has chosen a pediatrician, and has never had DSS/CPS involvement with her 65 YO child. The CSW will make a mandated CPS report should the cord blood return positive; however, if not, the CSW will sign off.  CSW Plan/Description:  CSW Will Continue to Monitor Umbilical Cord Tissue Drug Screen Results and Make Report if Frutoso Schatz, LCSW 09/25/2017, 11:33 AM

## 2017-09-25 NOTE — Progress Notes (Signed)
Patient recovering well. Morning and noon vitals stable and WDL. Bleeding scant, fundus firm. Patient states no pain this morning, pain around 5/10 at noon. Patient pain controlled with oral meds. Patient bottle feeding infant. Patient to ambulate a little more this afternoon.

## 2017-09-25 NOTE — Progress Notes (Signed)
Post Partum Day 1 Subjective: no complaints, up ad lib and voiding  Objective: Blood pressure 117/77, pulse 70, temperature 98.4 F (36.9 C), temperature source Oral, resp. rate 20, height 5\' 3"  (1.6 m), weight 180 lb (81.6 kg), last menstrual period 01/06/2017, SpO2 100 %, unknown if currently breastfeeding.  Physical Exam:  General: alert, cooperative and appears stated age Lochia: appropriate Uterine Fundus: firm Incision: NA DVT Evaluation: No evidence of DVT seen on physical exam. Negative Homan's sign.  Recent Labs    09/24/17 1515 09/25/17 0456  HGB 11.3* 10.3*  HCT 33.6* 30.7*    Assessment/Plan: Discharge home and Social Work consult, desires d/c home this evening if infant is able to go, otherwise will discharge tomorrow. Infant feeding  Bottle;   LOS: 1 day   Terree Gaultney N Lailoni Baquera 09/25/2017, 12:14 PM

## 2017-09-26 LAB — RPR: RPR Ser Ql: NONREACTIVE

## 2017-09-26 MED ORDER — MEDROXYPROGESTERONE ACETATE 150 MG/ML IM SUSP: 150.0000 mg | Freq: Once | INTRAMUSCULAR | 3 refills | Status: DC

## 2017-09-26 MED ORDER — DOCUSATE SODIUM 100 MG PO CAPS: 100.0000 mg | ORAL_CAPSULE | Freq: Every day | ORAL | 2 refills | Status: DC | PRN

## 2017-09-26 MED ORDER — MEDROXYPROGESTERONE ACETATE 150 MG/ML IM SUSP
150.0000 mg | Freq: Once | INTRAMUSCULAR | Status: AC
  Administered 2017-09-26: 150 mg via INTRAMUSCULAR
  Filled 2017-09-26: qty 1

## 2017-09-26 NOTE — Progress Notes (Signed)
Patient discharged home with infant. Discharge instructions, prescriptions and follow up appointment given to and reviewed with patient. Patient verbalized understanding. Patient wheeled out with infant by auxiliary.  

## 2017-09-26 NOTE — Discharge Summary (Signed)
Discharge Summary  Date of Admission: 09/24/2017  Date of Discharge: 09/26/2017  Admitting Diagnosis: Onset of Labor at [redacted]w[redacted]d  Mode of Delivery: normal spontaneous vaginal delivery                 Discharge Diagnosis: No other diagnosis   Intrapartum Procedures: laceration 2nd   Post partum procedures: none  Complications: none                      Discharge Day SOAP Note:  Progress Note - Vaginal Delivery  Elizabeth Frey is a 27 y.o. [id number] now PP day 2 s/p VBAC, Spontaneous . Delivery was uncomplicated  Subjective  The patient has the following complaints: has no unusual complaints  Pain is controlled with current medications.   Patient is urinating without difficulty.  She is ambulating well.    Objective  Vital signs: BP 122/87 (BP Location: Left Arm)   Pulse 65   Temp 98.2 F (36.8 C) (Oral)   Resp 18   Ht 5\' 3"  (1.6 m)   Wt 180 lb (81.6 kg)   LMP 01/06/2017   SpO2 99%   Breastfeeding? Unknown   BMI 31.89 kg/m   Physical Exam: Gen: NAD Lungs clear bilaterally Heart: RRR Fundus Fundal Tone: Firm  Lochia Amount: Small  Perineum Appearance: Intact, Approximated     Data Review Labs: CBC Latest Ref Rng & Units 09/25/2017 09/24/2017 07/15/2017  WBC 3.6 - 11.0 K/uL 12.8(H) 8.2 8.4  Hemoglobin 12.0 - 16.0 g/dL 10.3(L) 11.3(L) 11.5  Hematocrit 35.0 - 47.0 % 30.7(L) 33.6(L) 34.6  Platelets 150 - 440 K/uL 164 187 221   O POS  Assessment/Plan  Active Problems:   Labor and delivery, indication for care    Plan for discharge today.   Discharge Instructions: Per After Visit Summary. Activity: Advance as tolerated. Pelvic rest for 6 weeks.  Also refer to After Visit Summary Diet: Regular Medications: Allergies as of 09/26/2017   No Known Allergies     Medication List    STOP taking these medications   diphenhydrAMINE 25 MG tablet Commonly known as:  BENADRYL   pantoprazole 20 MG tablet Commonly known as:   PROTONIX     TAKE these medications   acetaminophen 500 MG tablet Commonly known as:  TYLENOL Take 500 mg every 6 (six) hours as needed by mouth for moderate pain or headache.   docusate sodium 100 MG capsule Commonly known as:  COLACE Take 1 capsule (100 mg total) by mouth daily as needed.   medroxyPROGESTERone 150 MG/ML injection Commonly known as:  DEPO-PROVERA Inject 1 mL (150 mg total) into the muscle once for 1 dose.   multivitamin-prenatal 27-0.8 MG Tabs tablet Take 1 tablet by mouth daily at 12 noon.      Outpatient follow up:  Postpartum contraception: Will discuss at first office visit post-partum  Discharged Condition: good  Discharged to: home  Newborn Data: Disposition:home with mother  Apgars: APGAR (1 MIN): 9   APGAR (5 MINS): 9   APGAR (10 MINS):    Baby Feeding: Bottle    Doreene Burke, CNM  09/26/2017 9:30 AM

## 2017-09-26 NOTE — Final Progress Note (Signed)
Discharge Day SOAP Note:  Progress Note - Vaginal Delivery  Elizabeth Frey is a 27 y.o. [id number] now PP day 2 s/p VBAC, Spontaneous . Delivery was uncomplicated  Subjective  The patient has the following complaints: has no unusual complaints  Pain is controlled with current medications.   Patient is urinating without difficulty.  She is ambulating well.    Objective  Vital signs: BP 122/87 (BP Location: Left Arm)   Pulse 65   Temp 98.2 F (36.8 C) (Oral)   Resp 18   Ht 5\' 3"  (1.6 m)   Wt 180 lb (81.6 kg)   LMP 01/06/2017   SpO2 99%   Breastfeeding? Unknown   BMI 31.89 kg/m   Physical Exam: Gen: NAD Lungs clear bilaterally Heart: RRR Fundus Fundal Tone: Firm  Lochia Amount: Small  Perineum Appearance: Intact, Approximated     Data Review Labs: CBC Latest Ref Rng & Units 09/25/2017 09/24/2017 07/15/2017  WBC 3.6 - 11.0 K/uL 12.8(H) 8.2 8.4  Hemoglobin 12.0 - 16.0 g/dL 10.3(L) 11.3(L) 11.5  Hematocrit 35.0 - 47.0 % 30.7(L) 33.6(L) 34.6  Platelets 150 - 440 K/uL 164 187 221   O POS  Assessment/Plan  Active Problems:   Labor and delivery, indication for care    Plan for discharge today.   Discharge Instructions: Per After Visit Summary. Activity: Advance as tolerated. Pelvic rest for 6 weeks.  Also refer to After Visit Summary Diet: Regular Medications: Allergies as of 09/26/2017   No Known Allergies     Medication List    STOP taking these medications   diphenhydrAMINE 25 MG tablet Commonly known as:  BENADRYL   pantoprazole 20 MG tablet Commonly known as:  PROTONIX     TAKE these medications   acetaminophen 500 MG tablet Commonly known as:  TYLENOL Take 500 mg every 6 (six) hours as needed by mouth for moderate pain or headache.   docusate sodium 100 MG capsule Commonly known as:  COLACE Take 1 capsule (100 mg total) by mouth daily as needed.   medroxyPROGESTERone 150 MG/ML injection Commonly known as:  DEPO-PROVERA Inject 1 mL (150 mg  total) into the muscle once for 1 dose.   multivitamin-prenatal 27-0.8 MG Tabs tablet Take 1 tablet by mouth daily at 12 noon.      Outpatient follow up:  Postpartum contraception: Will discuss at first office visit post-partum  Discharged Condition: good  Discharged to: home  Newborn Data: Disposition:home with mother  Apgars: APGAR (1 MIN): 9   APGAR (5 MINS): 9   APGAR (10 MINS):    Baby Feeding: Bottle    Doreene Burke, CNM  09/26/2017 9:30 AM

## 2017-10-03 ENCOUNTER — Encounter: Payer: Self-pay | Admitting: Obstetrics and Gynecology

## 2017-10-04 NOTE — Telephone Encounter (Signed)
Patient came into office to pick up paperwork. The paperwork provided was not the right one. It was the [employer] short term disability form that she needs the provider to fill out. It was dropped last Monday. Patient states if you have any questions you can call her. Her contact# is  [phone]. Please advise. Thank you

## 2017-10-06 NOTE — Discharge Summary (Signed)
L&D OB Triage Note  SOLYMAR GRACE is a 27 y.o. [id number] female at [redacted]w[redacted]d, EDD Estimated Date of Delivery: 10/08/17 who presented to triage for complaints of irregular contractions.  She was evaluated by the nurses with no significant findings/findings significant for active labor. Vital signs stable. An NST was performed and has been reviewed by Me. She was treated offered medicine to help her sleep and declined.  NST INTERPRETATION: Indications: rule out uterine contractions  Mode: (sitting up in bed, tracing maternal) Baseline Rate (A): 145 bpm(fhr at this time) Variability: Moderate Accelerations: 15 x 15 Decelerations: Variable(x1)     Contraction Frequency (min): 9-10 w/irritability  Impression: reactive   Plan: NST performed was reviewed and was found to be reactive. She was discharged home with bleeding/labor precautions.  Continue routine prenatal care. Follow up with OB/GYN as previously scheduled.     Melody Suzan Nailer, CNM

## 2017-11-04 ENCOUNTER — Encounter: Payer: Self-pay | Admitting: Obstetrics and Gynecology

## 2017-11-04 ENCOUNTER — Ambulatory Visit (INDEPENDENT_AMBULATORY_CARE_PROVIDER_SITE_OTHER): Payer: Medicaid Other | Admitting: Obstetrics and Gynecology

## 2017-11-04 DIAGNOSIS — K644 Residual hemorrhoidal skin tags: Secondary | ICD-10-CM | POA: Diagnosis not present

## 2017-11-04 NOTE — Patient Instructions (Signed)
  Place postpartum visit patient instructions here.  

## 2017-11-04 NOTE — Progress Notes (Signed)
  Subjective:     Elizabeth Frey is a 27 y.o. female who presents for a postpartum visit. She is 6 weeks postpartum following a spontaneous vaginal delivery. I have fully reviewed the prenatal and intrapartum course. The delivery was at 40 gestational weeks. Outcome: spontaneous vaginal delivery. Anesthesia: none. Postpartum course has been uncomplicated. Baby's course has been uncomplicated. Baby is feeding by formula. Bleeding no bleeding. Bowel function is normal. Bladder function is normal. Patient is not sexually active. Is having pain with hemorrhoid with each BM. Contraception method is abstinence. Postpartum depression screening: negative.  The following portions of the patient's history were reviewed and updated as appropriate: allergies, current medications, past family history, past medical history, past social history, past surgical history and problem list.  Review of Systems Pertinent items noted in HPI and remainder of comprehensive ROS otherwise negative.   Objective:    BP 123/88   Pulse 78   Ht 5\' 3"  (1.6 m)   Wt 164 lb 9.6 oz (74.7 kg)   BMI 29.16 kg/m   General:  alert, cooperative and appears stated age   Breasts:  inspection negative, no nipple discharge or bleeding, no masses or nodularity palpable  Lungs: clear to auscultation bilaterally  Heart:  regular rate and rhythm, S1, S2 normal, no murmur, click, rub or gallop  Abdomen: soft, non-tender; bowel sounds normal; no masses,  no organomegaly   Vulva:  normal  Vagina: normal vagina, no discharge, exudate, lesion, or erythema  Cervix:  multiparous appearance  Corpus: normal size, contour, position, consistency, mobility, non-tender  Adnexa:  no mass, fullness, tenderness  Rectal Exam: external hemorrhoids        Assessment:     6 weeks postpartum exam. Pap smear not done at today's visit. external hemorrhoid at 3 o clock  Plan:    1. Contraception: Depo-Provera injections 2. Labs obtained will follow  up accordingly 3. Follow up in:6 weeks for Depo & 3 months for annual.   4. Will consider referral if hemorrhoid gets larger

## 2017-11-05 LAB — CBC
HEMATOCRIT: 40.5 % (ref 34.0–46.6)
HEMOGLOBIN: 12.6 g/dL (ref 11.1–15.9)
MCH: 27.9 pg (ref 26.6–33.0)
MCHC: 31.1 g/dL — AB (ref 31.5–35.7)
MCV: 90 fL (ref 79–97)
Platelets: 244 10*3/uL (ref 150–450)
RBC: 4.51 x10E6/uL (ref 3.77–5.28)
RDW: 15.4 % (ref 12.3–15.4)
WBC: 7.1 10*3/uL (ref 3.4–10.8)

## 2017-11-05 LAB — VITAMIN D 25 HYDROXY (VIT D DEFICIENCY, FRACTURES): VIT D 25 HYDROXY: 6.5 ng/mL — AB (ref 30.0–100.0)

## 2017-11-05 LAB — FERRITIN: Ferritin: 60 ng/mL (ref 15–150)

## 2017-11-08 ENCOUNTER — Telehealth: Payer: Self-pay | Admitting: *Deleted

## 2017-11-08 ENCOUNTER — Other Ambulatory Visit: Payer: Self-pay | Admitting: Obstetrics and Gynecology

## 2017-11-08 DIAGNOSIS — E559 Vitamin D deficiency, unspecified: Secondary | ICD-10-CM

## 2017-11-08 MED ORDER — VITAMIN D (ERGOCALCIFEROL) 1.25 MG (50000 UNIT) PO CAPS: 50000.0000 [IU] | ORAL_CAPSULE | ORAL | 1 refills | Status: DC

## 2017-11-08 NOTE — Telephone Encounter (Signed)
-----   Message from Purcell Nails, [place] sent at 11/08/2017  8:50 AM EDT ----- Mail info on Vit D

## 2017-11-08 NOTE — Telephone Encounter (Signed)
Mailed info to pt 

## 2017-12-13 ENCOUNTER — Other Ambulatory Visit: Payer: Self-pay | Admitting: *Deleted

## 2017-12-13 ENCOUNTER — Telehealth: Payer: Self-pay | Admitting: Obstetrics and Gynecology

## 2017-12-13 MED ORDER — MEDROXYPROGESTERONE ACETATE 150 MG/ML IM SUSP: 150.0000 mg | Freq: Once | INTRAMUSCULAR | 3 refills | Status: DC

## 2017-12-13 NOTE — Telephone Encounter (Signed)
Done-ac 

## 2017-12-13 NOTE — Telephone Encounter (Signed)
The patient called requesting a refill on her Depo before her appointment next week on Friday;  12/16/17 @ 1:30, please advise, thanks.

## 2017-12-16 ENCOUNTER — Ambulatory Visit (INDEPENDENT_AMBULATORY_CARE_PROVIDER_SITE_OTHER): Payer: Medicaid Other | Admitting: Obstetrics and Gynecology

## 2017-12-16 VITALS — BP 111/73 | HR 74 | Ht 63.0 in | Wt 170.0 lb

## 2017-12-16 DIAGNOSIS — Z3042 Encounter for surveillance of injectable contraceptive: Secondary | ICD-10-CM

## 2017-12-16 MED ORDER — MEDROXYPROGESTERONE ACETATE 150 MG/ML IM SUSP
150.0000 mg | Freq: Once | INTRAMUSCULAR | Status: AC
  Administered 2017-12-16: 150 mg via INTRAMUSCULAR

## 2017-12-16 NOTE — Progress Notes (Signed)
Date last pap: n/a  Last Depo-Provera: 09/26/2017 at pp discharge Side Effects if any: wt gain.  Serum HCG indicated? N/a  Depo-Provera 150 mg IM given by: CM Next appointment due- 9/20--------10/4.  BP 111/73   Pulse 74   Ht 5\' 3"  (1.6 m)   Wt 170 lb (77.1 kg)   Breastfeeding? No   BMI 30.11 kg/m

## 2018-03-02 ENCOUNTER — Encounter: Payer: Medicaid Other | Admitting: Obstetrics and Gynecology

## 2018-03-03 ENCOUNTER — Ambulatory Visit: Payer: Medicaid Other

## 2018-04-23 IMAGING — US US ABDOMEN LIMITED
1 series · 14 of 25 positions shown · non-contrast
Comparison: None.

CLINICAL DATA: Epigastric abdominal pain for the past 3 weeks.
Fifteen weeks and 4 days pregnant.

EXAM:
ULTRASOUND ABDOMEN LIMITED RIGHT UPPER QUADRANT

[Series 1: us abdomen limited · 0.23mm/px · 14 of 58 slices shown]
[im 1/58]
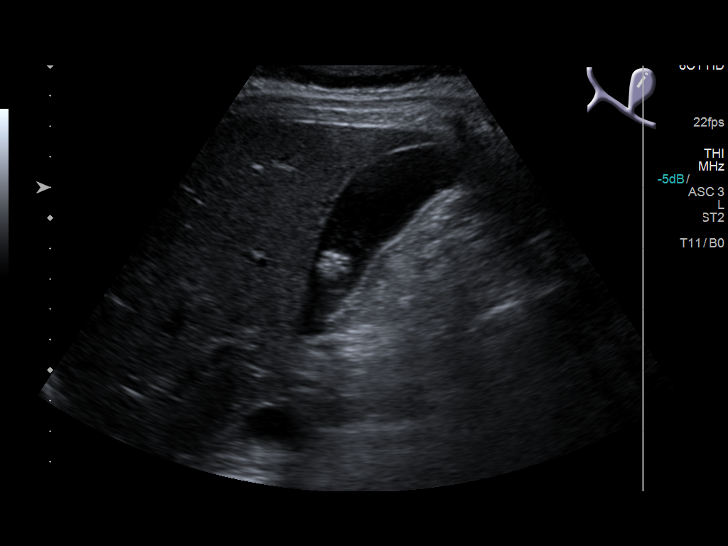
[im 5/58]
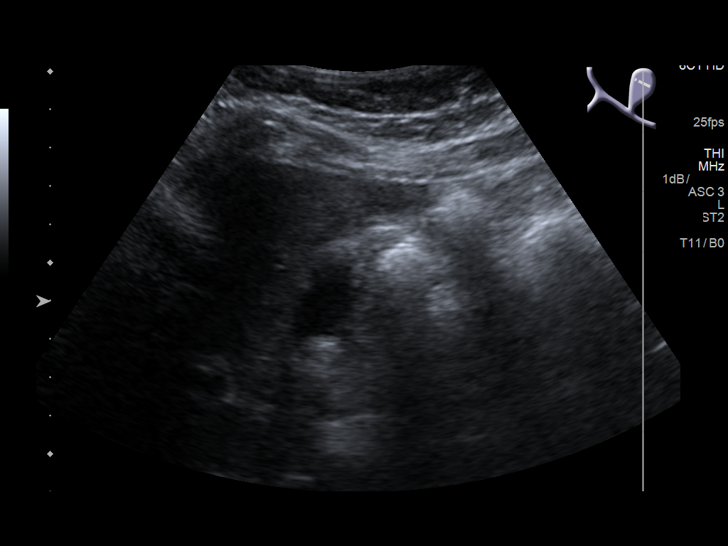
[im 10/58]
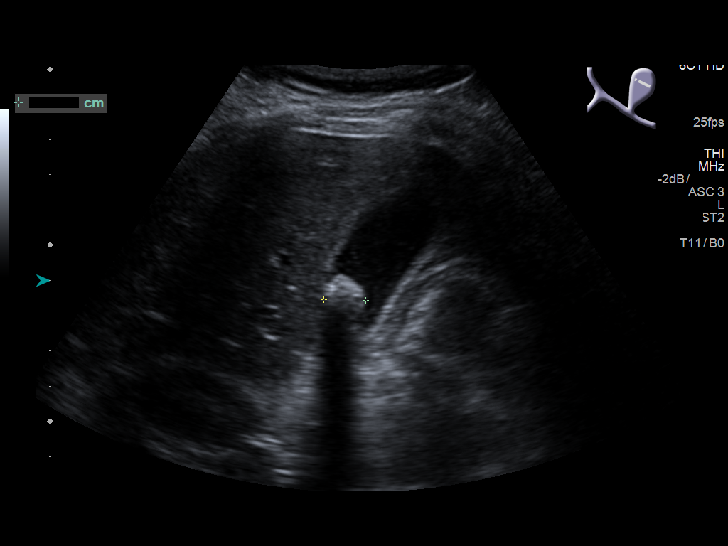
[im 15/58]
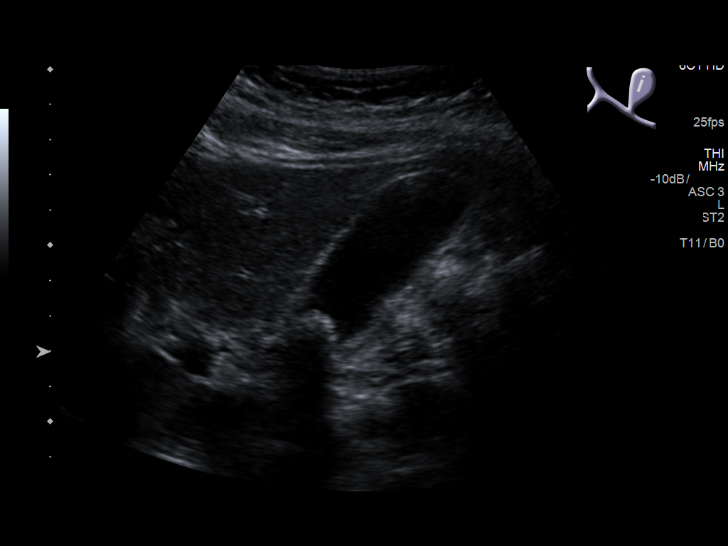
[im 20/58]
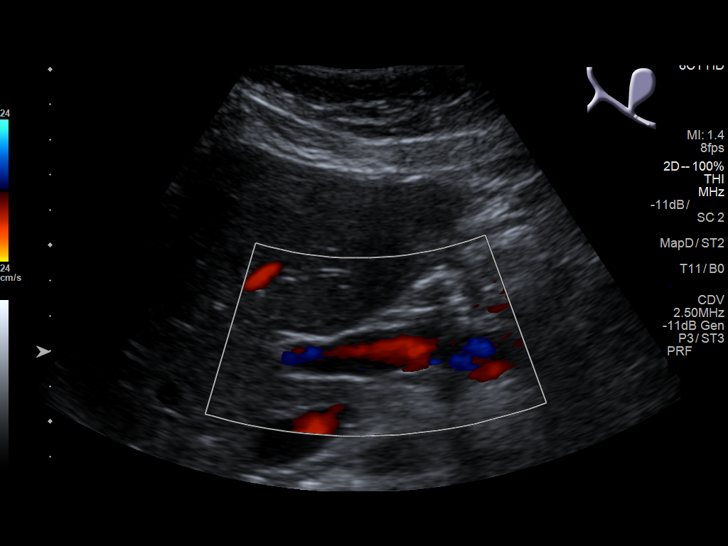
[im 22/58]
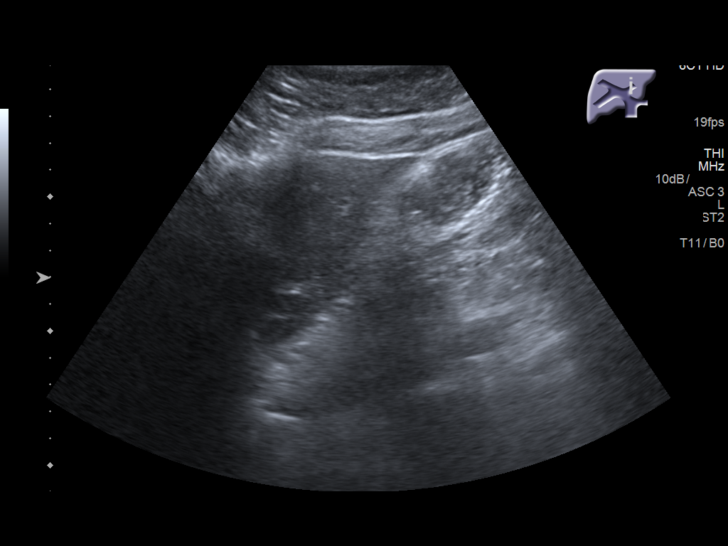
[im 27/58]
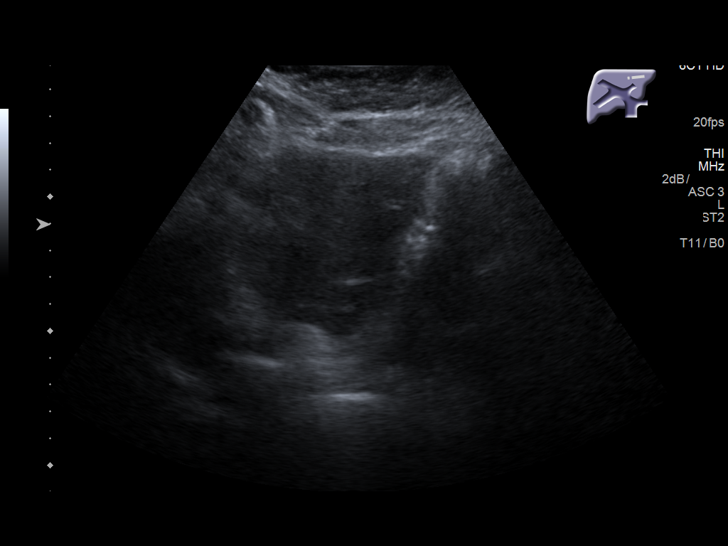
[im 31/58]
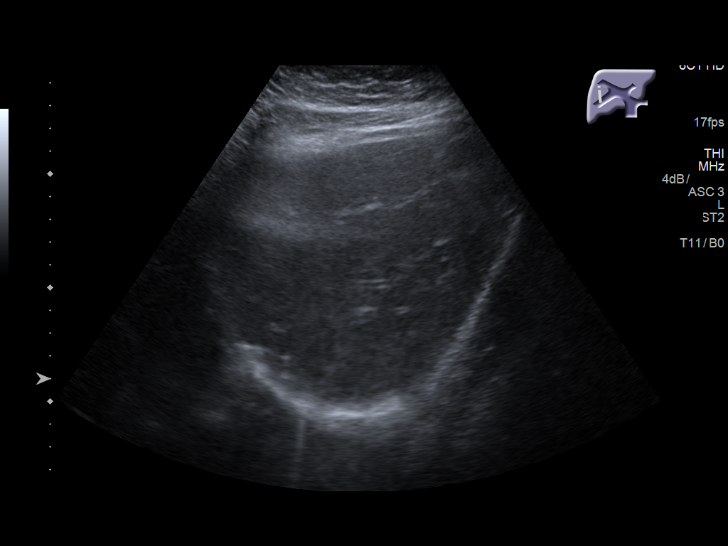
[im 36/58]
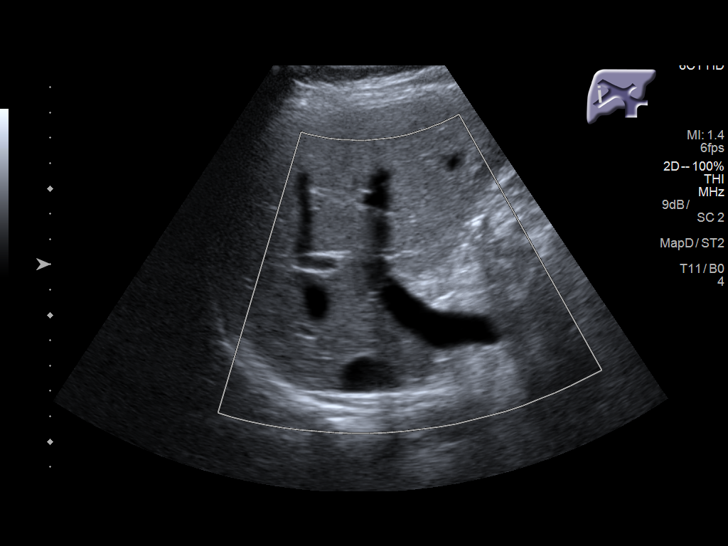
[im 39/58]
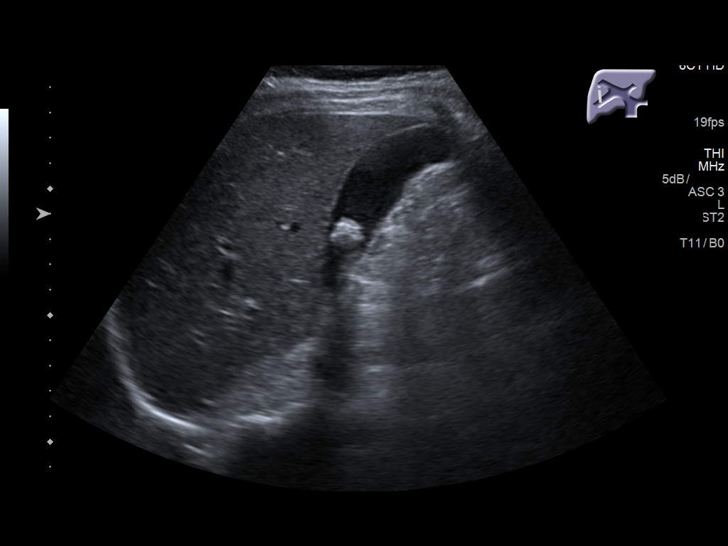
[im 43/58]
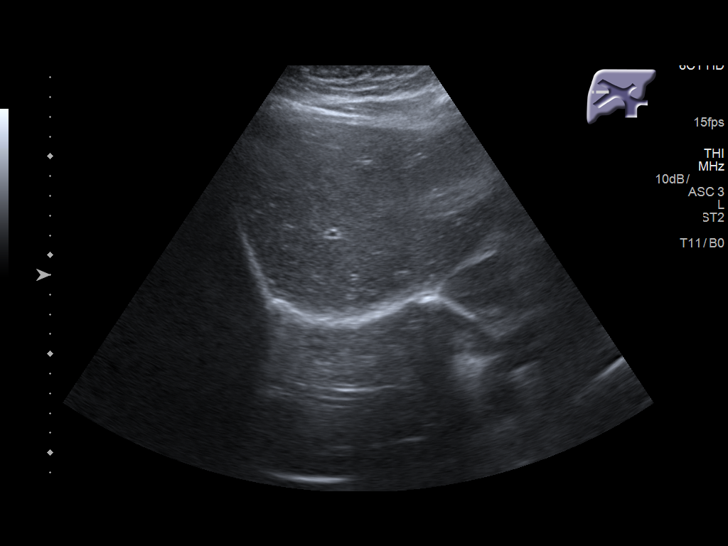
[im 48/58]
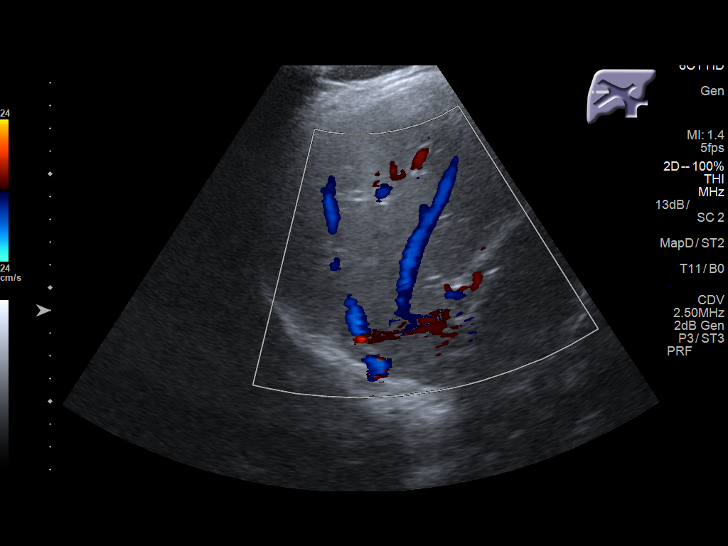
[im 53/58]
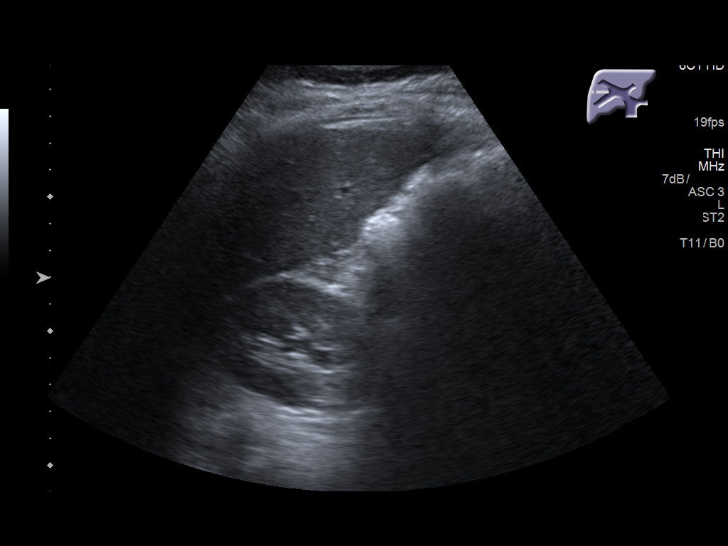
[im 58/58]
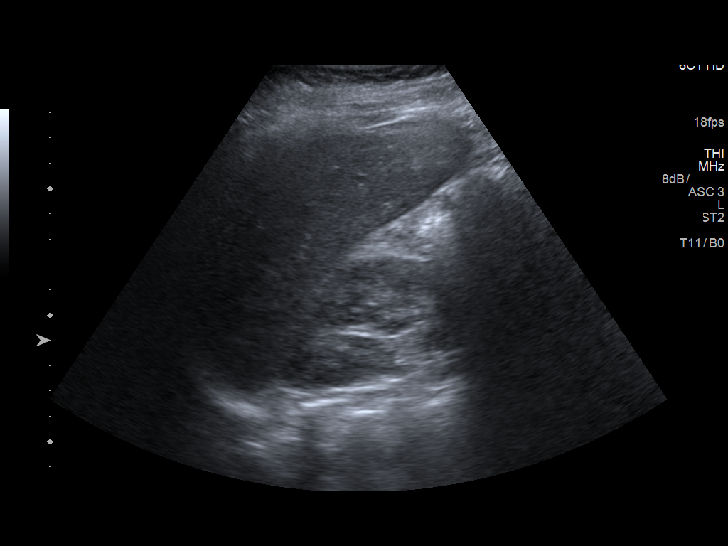

[14 of 25 positions shown; findings below may reference images not displayed]

FINDINGS: Gallbladder:

1.2 cm gallstone in the gallbladder without gallbladder wall
thickening or pericholecystic fluid. No sonographic Murphy sign.

Common bile duct:

Diameter: 3.3 mm

Liver:

No focal lesion identified. Within normal limits in parenchymal
echogenicity. Portal vein is patent on color Doppler imaging with
normal direction of blood flow towards the liver.
IMPRESSION: Cholelithiasis without evidence of cholecystitis.

## 2018-07-01 ENCOUNTER — Encounter: Payer: Self-pay | Admitting: Emergency Medicine

## 2018-07-01 ENCOUNTER — Emergency Department
Admission: EM | Admit: 2018-07-01 | Discharge: 2018-07-01 | Disposition: A | Discharge status: Home / Self Care | Payer: Self-pay | Attending: Emergency Medicine | Admitting: Emergency Medicine

## 2018-07-01 ENCOUNTER — Other Ambulatory Visit: Payer: Self-pay

## 2018-07-01 ENCOUNTER — Emergency Department: Payer: Self-pay

## 2018-07-01 DIAGNOSIS — R1013 Epigastric pain: Secondary | ICD-10-CM

## 2018-07-01 DIAGNOSIS — K8020 Calculus of gallbladder without cholecystitis without obstruction: Secondary | ICD-10-CM | POA: Insufficient documentation

## 2018-07-01 DIAGNOSIS — Z79899 Other long term (current) drug therapy: Secondary | ICD-10-CM | POA: Insufficient documentation

## 2018-07-01 LAB — CBC
HEMATOCRIT: 37.8 % (ref 36.0–46.0)
Hemoglobin: 12.5 g/dL (ref 12.0–15.0)
MCH: 31 pg (ref 26.0–34.0)
MCHC: 33.1 g/dL (ref 30.0–36.0)
MCV: 93.8 fL (ref 80.0–100.0)
NRBC: 0 % (ref 0.0–0.2)
PLATELETS: 174 10*3/uL (ref 150–400)
RBC: 4.03 MIL/uL (ref 3.87–5.11)
RDW: 12.8 % (ref 11.5–15.5)
WBC: 8.6 10*3/uL (ref 4.0–10.5)

## 2018-07-01 LAB — COMPREHENSIVE METABOLIC PANEL
ALK PHOS: 81 U/L (ref 38–126)
ALT: 22 U/L (ref 0–44)
AST: 18 U/L (ref 15–41)
Albumin: 3.9 g/dL (ref 3.5–5.0)
Anion gap: 6 (ref 5–15)
BILIRUBIN TOTAL: 0.8 mg/dL (ref 0.3–1.2)
BUN: 13 mg/dL (ref 6–20)
CALCIUM: 8.8 mg/dL — AB (ref 8.9–10.3)
CHLORIDE: 109 mmol/L (ref 98–111)
CO2: 23 mmol/L (ref 22–32)
CREATININE: 0.83 mg/dL (ref 0.44–1.00)
Glucose, Bld: 84 mg/dL (ref 70–99)
Potassium: 3.6 mmol/L (ref 3.5–5.1)
Sodium: 138 mmol/L (ref 135–145)
Total Protein: 7.9 g/dL (ref 6.5–8.1)

## 2018-07-01 LAB — URINALYSIS, COMPLETE (UACMP) WITH MICROSCOPIC
BILIRUBIN URINE: NEGATIVE
Glucose, UA: NEGATIVE mg/dL
HGB URINE DIPSTICK: NEGATIVE
Ketones, ur: NEGATIVE mg/dL
NITRITE: NEGATIVE
Protein, ur: 30 mg/dL — AB
SPECIFIC GRAVITY, URINE: 1.03 (ref 1.005–1.030)
pH: 5 (ref 5.0–8.0)

## 2018-07-01 LAB — POCT PREGNANCY, URINE: PREG TEST UR: NEGATIVE

## 2018-07-01 LAB — LIPASE, BLOOD: Lipase: 28 U/L (ref 11–51)

## 2018-07-01 MED ORDER — SODIUM CHLORIDE 0.9% FLUSH: 3.0000 mL | Freq: Once | INTRAVENOUS | Status: DC

## 2018-07-01 MED ORDER — TRAMADOL HCL 50 MG PO TABS: 50.0000 mg | ORAL_TABLET | Freq: Four times a day (QID) | ORAL | 0 refills | Status: DC | PRN

## 2018-07-01 NOTE — ED Triage Notes (Signed)
Pt to ED via POV c/o recurrent epigastric abdominal pain. Pt states that she had same issue last year while pregnant with her daughter and was evaluated. Pt states that it feels like something is "stuck". Pt is in NAD.

## 2018-07-01 NOTE — ED Provider Notes (Signed)
[facility] Emergency Department Provider Note   ____________________________________________   I have reviewed the triage vital signs and the nursing notes.   HISTORY  Chief Complaint Abdominal Pain   History limited by: Not Limited   HPI Elizabeth Frey is a 28 y.o. female who presents to the emergency department today because of concern for abdominal pain. The pain is located in the epigastric region. The pain has been present for the past couple of weeks. It does come and go. It seems to be worse in the morning. It does remind her of pain she had last year when she was pregnant. Was told at that time that she had gallstone. She states that she had been doing fine until a couple of weeks ago. Cannot think of any unusual ingestion recently. Has had what she describes as hot flashes without any fevers.    Per medical record review patient has a history of gallstones  Past Medical History:  Diagnosis Date  . Gallstones   . Headache(784.0)    non-migraine; every 1-2 days  . 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 Vitamin D deficiency 11/08/2017  . History of bilateral breast reduction surgery 07/15/2017    Past Surgical History:  Procedure Laterality Date  . BREAST REDUCTION SURGERY  08/24/2011   Procedure: MAMMARY REDUCTION BILATERAL (BREAST);  Surgeon: Karie Fetch, MD;  Location: [facility];  Service: Plastics;  Laterality: Bilateral;  . BREAST SURGERY    . CESAREAN SECTION    . DILATION AND CURETTAGE OF UTERUS  2008  . WISDOM TOOTH EXTRACTION      Prior to Admission medications   Medication Sig Start Date End Date Taking? Authorizing Provider  medroxyPROGESTERone (DEPO-PROVERA) 150 MG/ML injection Inject 150 mg into the muscle every 3 (three) months.    [provider]  Vitamin D, Ergocalciferol, (DRISDOL) 50000 units CAPS capsule Take 1 capsule (50,000 Units total) by  mouth 2 (two) times a week. 11/10/17   Purcell Nails, CNM    Allergies Patient has no known allergies.  No family history on file.  Social History Social History   Tobacco Use  . Smoking status: Never Smoker  . Smokeless tobacco: Never Used  Substance Use Topics  . Alcohol use: No  . Drug use: No    Review of Systems Constitutional: Positive for subjective fever. Eyes: No visual changes. ENT: No sore throat. Cardiovascular: Denies chest pain. Respiratory: Denies shortness of breath. Gastrointestinal: Positive for epigastric pain.   Genitourinary: Negative for dysuria. Musculoskeletal: Negative for back pain. Skin: Negative for rash. Neurological: Negative for headaches, focal weakness or numbness.  ____________________________________________   PHYSICAL EXAM:  VITAL SIGNS: ED Triage Vitals  Enc Vitals Group     BP 07/01/18 1312 133/80     Pulse Rate 07/01/18 1312 84     Resp 07/01/18 1312 16     Temp 07/01/18 1312 98.5 F (36.9 C)     Temp Source 07/01/18 1312 Oral     SpO2 07/01/18 1312 97 %     Weight 07/01/18 1313 165 lb (74.8 kg)     Height 07/01/18 1313 5\' 2"  (1.575 m)     Head Circumference --      Peak Flow --      Pain Score 07/01/18 1316 10   Constitutional: Alert and oriented.  Eyes: Conjunctivae are normal.  ENT      Head: Normocephalic and atraumatic.  Nose: No congestion/rhinnorhea.      Mouth/Throat: Mucous membranes are moist.      Neck: No stridor. Hematological/Lymphatic/Immunilogical: No cervical lymphadenopathy. Cardiovascular: Normal rate, regular rhythm.  No murmurs, rubs, or gallops. Respiratory: Normal respiratory effort without tachypnea nor retractions. Breath sounds are clear and equal bilaterally. No wheezes/rales/rhonchi. Gastrointestinal: Soft and tender to palpation in the epigastric region and right upper quadrant.  Genitourinary: Deferred Musculoskeletal: Normal range of motion in all extremities. No lower  extremity edema. Neurologic:  Normal speech and language. No gross focal neurologic deficits are appreciated.  Skin:  Skin is warm, dry and intact. No rash noted. Psychiatric: Mood and affect are normal. Speech and behavior are normal. Patient exhibits appropriate insight and judgment.  ____________________________________________    LABS (pertinent positives/negatives)  Upreg negative CBC wbc 8.6, hgb 12.5, plt 174 Lipase 28 CMP wnl except ca 8.8  ____________________________________________   EKG  None  ____________________________________________    RADIOLOGY  RUQ [place] Gallstones without evidence of acute cholecystitis  ____________________________________________   PROCEDURES  Procedures  ____________________________________________   INITIAL IMPRESSION / ASSESSMENT AND PLAN / ED COURSE  Pertinent labs & imaging results that were available during my care of the patient were reviewed by me and considered in my medical decision making (see chart for details).   Patient presented to the emergency department today because of concerns for epigastric and right upper quadrant pain.  Says been going on for the past couple weeks.  It is intermittent sometimes worse in the morning.  On exam she is mildly tender in the epigastric and right upper quadrant.  Ultrasound shows gallstones but no signs of cholecystitis.  Discussed this finding with the patient.  This point I think is safe for patient be discharged home.  Will give patient surgery follow-up information.  ____________________________________________   FINAL CLINICAL IMPRESSION(S) / ED DIAGNOSES  Final diagnoses:  Epigastric abdominal pain  Gallstones     Note: This dictation was prepared with Dragon dictation. Any transcriptional errors that result from this process are unintentional     Phineas Semen, MD 07/01/18 1550

## 2018-07-01 NOTE — Discharge Instructions (Addendum)
Please seek medical attention for any high fevers, chest pain, shortness of breath, change in behavior, persistent vomiting, bloody stool or any other new or concerning symptoms.  

## 2018-07-29 ENCOUNTER — Encounter: Payer: Self-pay | Admitting: Emergency Medicine

## 2018-07-29 ENCOUNTER — Inpatient Hospital Stay
Admission: EM | Admit: 2018-07-29 | Discharge: 2018-07-31 | DRG: 419 | Disposition: A | Discharge status: Home / Self Care | Payer: Medicaid Other | Attending: Surgery | Admitting: Surgery

## 2018-07-29 ENCOUNTER — Other Ambulatory Visit: Payer: Self-pay

## 2018-07-29 ENCOUNTER — Emergency Department: Payer: Medicaid Other

## 2018-07-29 DIAGNOSIS — R1011 Right upper quadrant pain: Secondary | ICD-10-CM | POA: Diagnosis present

## 2018-07-29 DIAGNOSIS — K8044 Calculus of bile duct with chronic cholecystitis without obstruction: Secondary | ICD-10-CM | POA: Diagnosis present

## 2018-07-29 DIAGNOSIS — K8050 Calculus of bile duct without cholangitis or cholecystitis without obstruction: Secondary | ICD-10-CM | POA: Diagnosis present

## 2018-07-29 LAB — CBC
HCT: 39.9 % (ref 36.0–46.0)
Hemoglobin: 13.1 g/dL (ref 12.0–15.0)
MCH: 31.2 pg (ref 26.0–34.0)
MCHC: 32.8 g/dL (ref 30.0–36.0)
MCV: 95 fL (ref 80.0–100.0)
NRBC: 0 % (ref 0.0–0.2)
PLATELETS: 218 10*3/uL (ref 150–400)
RBC: 4.2 MIL/uL (ref 3.87–5.11)
RDW: 12.9 % (ref 11.5–15.5)
WBC: 9.2 10*3/uL (ref 4.0–10.5)

## 2018-07-29 LAB — URINALYSIS, COMPLETE (UACMP) WITH MICROSCOPIC
BILIRUBIN URINE: NEGATIVE
GLUCOSE, UA: NEGATIVE mg/dL
HGB URINE DIPSTICK: NEGATIVE
Ketones, ur: NEGATIVE mg/dL
NITRITE: NEGATIVE
PH: 6 (ref 5.0–8.0)
Protein, ur: NEGATIVE mg/dL
Specific Gravity, Urine: 1.017 (ref 1.005–1.030)

## 2018-07-29 LAB — COMPREHENSIVE METABOLIC PANEL
ALK PHOS: 74 U/L (ref 38–126)
ALT: 25 U/L (ref 0–44)
ANION GAP: 6 (ref 5–15)
AST: 21 U/L (ref 15–41)
Albumin: 3.9 g/dL (ref 3.5–5.0)
BUN: 11 mg/dL (ref 6–20)
CO2: 22 mmol/L (ref 22–32)
Calcium: 8.8 mg/dL — ABNORMAL LOW (ref 8.9–10.3)
Chloride: 111 mmol/L (ref 98–111)
Creatinine, Ser: 0.87 mg/dL (ref 0.44–1.00)
Glucose, Bld: 128 mg/dL — ABNORMAL HIGH (ref 70–99)
Potassium: 3.2 mmol/L — ABNORMAL LOW (ref 3.5–5.1)
SODIUM: 139 mmol/L (ref 135–145)
TOTAL PROTEIN: 8 g/dL (ref 6.5–8.1)
Total Bilirubin: 0.7 mg/dL (ref 0.3–1.2)

## 2018-07-29 LAB — I-STAT BETA HCG BLOOD, ED (NOT ORDERABLE): I-stat hCG, quantitative: <5 m[IU]/mL (ref <5)

## 2018-07-29 LAB — LIPASE, BLOOD: LIPASE: 29 U/L (ref 11–51)

## 2018-07-29 MED ORDER — ONDANSETRON 4 MG PO TBDP: 4.0000 mg | ORAL_TABLET | Freq: Four times a day (QID) | ORAL | Status: DC | PRN

## 2018-07-29 MED ORDER — PANTOPRAZOLE SODIUM 40 MG IV SOLR
40.0000 mg | Freq: Every day | INTRAVENOUS | Status: DC
  Administered 2018-07-29: 40 mg via INTRAVENOUS
  Filled 2018-07-29: qty 40

## 2018-07-29 MED ORDER — MORPHINE SULFATE (PF) 2 MG/ML IV SOLN: 2.0000 mg | INTRAVENOUS | Status: DC | PRN

## 2018-07-29 MED ORDER — MORPHINE SULFATE (PF) 4 MG/ML IV SOLN
4.0000 mg | Freq: Once | INTRAVENOUS | Status: AC
  Administered 2018-07-29: 4 mg via INTRAVENOUS
  Filled 2018-07-29: qty 1

## 2018-07-29 MED ORDER — DOCUSATE SODIUM 100 MG PO CAPS: 100.0000 mg | ORAL_CAPSULE | Freq: Two times a day (BID) | ORAL | Status: DC | PRN

## 2018-07-29 MED ORDER — LACTATED RINGERS IV SOLN
INTRAVENOUS | Status: DC
  Administered 2018-07-29 – 2018-07-30 (×2): via INTRAVENOUS

## 2018-07-29 MED ORDER — ONDANSETRON HCL 4 MG/2ML IJ SOLN: 4.0000 mg | Freq: Four times a day (QID) | INTRAMUSCULAR | Status: DC | PRN

## 2018-07-29 MED ORDER — SODIUM CHLORIDE 0.9 % IV BOLUS
1000.0000 mL | Freq: Once | INTRAVENOUS | Status: AC
  Administered 2018-07-29: 1000 mL via INTRAVENOUS

## 2018-07-29 MED ORDER — SODIUM CHLORIDE 0.9 % IV SOLN
2.0000 g | INTRAVENOUS | Status: DC
  Administered 2018-07-30: 2 g via INTRAVENOUS
  Filled 2018-07-29: qty 2
  Filled 2018-07-29: qty 20

## 2018-07-29 MED ORDER — ONDANSETRON 4 MG PO TBDP
4.0000 mg | ORAL_TABLET | Freq: Once | ORAL | Status: AC | PRN
  Administered 2018-07-29: 4 mg via ORAL
  Filled 2018-07-29: qty 1

## 2018-07-29 MED ORDER — TRAMADOL HCL 50 MG PO TABS: 50.0000 mg | ORAL_TABLET | Freq: Four times a day (QID) | ORAL | Status: DC | PRN

## 2018-07-29 NOTE — ED Notes (Signed)
Pt returned from US

## 2018-07-29 NOTE — ED Notes (Signed)
IVF still infusing at this time. Pt lying on stretcher, no distress noted. Warm blanket covering pt.

## 2018-07-29 NOTE — ED Notes (Signed)
Pt ambulatory to toilet with steady gait. Has urine cup and is aware of need to catch urine sample.

## 2018-07-29 NOTE — ED Triage Notes (Signed)
Patient presents to the ED via EMS from home for abdominal pain and vomiting.  Patient is complaining of epigastric pain and reports history of gallstones with similar pain.  Patient vomited a large amount while EMS was taking patient to triage in wheelchair.  EMS also reported a pre-syncopal episode where patient told EMS she felt like she was going to pass out and she became very pale.  Patient appears very uncomfortable in triage.

## 2018-07-29 NOTE — ED Notes (Signed)
Primary RN notified pt moving to [facility]

## 2018-07-29 NOTE — ED Provider Notes (Addendum)
[facility] Emergency Department Provider Note  ____________________________________________   I have reviewed the triage vital signs and the nursing notes. Where available I have reviewed prior notes and, if possible and indicated, outside hospital notes.    HISTORY  Chief Complaint Abdominal Pain    HPI Elizabeth Frey is a 28 y.o. female  Who presents today complaining of right upper quadrant quadrant.  Patient has a history of gallstones, her last flare was last month.  At that time, he did have an ultrasound which showed cholelithiasis but no cholecystitis and she is been doing well since that last visit with no flares until this morning around 10:00 she began to have right upper quadrant abdominal pain and vomiting.  She states it hurts worse than it did last time.  Pain is been persistent since 10 AM.  Nothing makes it better nothing makes it worse no other alleviating or aggravating symptoms no other prior treatment was seen by EMS, did feel lightheaded while vomiting.  No bloody or bilious vomiting, denies pregnancy.  No melena no bright red blood per rectum.   Past Medical History:  Diagnosis Date  . Gallstones   . Headache(784.0)    non-migraine; every 1-2 days  . 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 Vitamin D deficiency 11/08/2017  . History of bilateral breast reduction surgery 07/15/2017    Past Surgical History:  Procedure Laterality Date  . BREAST REDUCTION SURGERY  08/24/2011   Procedure: MAMMARY REDUCTION BILATERAL (BREAST);  Surgeon: Karie Fetch, MD;  Location: [facility];  Service: Plastics;  Laterality: Bilateral;  . BREAST SURGERY    . CESAREAN SECTION    . DILATION AND CURETTAGE OF UTERUS  2008  . WISDOM TOOTH EXTRACTION      Prior to Admission medications   Medication Sig Start Date End Date Taking? Authorizing Provider  medroxyPROGESTERone (DEPO-PROVERA) 150  MG/ML injection Inject 150 mg into the muscle every 3 (three) months.    [provider]  traMADol (ULTRAM) 50 MG tablet Take 1 tablet (50 mg total) by mouth every 6 (six) hours as needed. 07/01/18 07/01/19  Phineas Semen, MD    Allergies Patient has no known allergies.  No family history on file.  Social History Social History   Tobacco Use  . Smoking status: Never Smoker  . Smokeless tobacco: Never Used  Substance Use Topics  . Alcohol use: No  . Drug use: No    Review of Systems Constitutional: No fever/chills Eyes: No visual changes. ENT: No sore throat. No stiff neck no neck pain Cardiovascular: Denies chest pain. Respiratory: Denies shortness of breath. Gastrointestinal:   +vomiting.  No diarrhea.  No constipation. Genitourinary: Negative for dysuria. Musculoskeletal: Negative lower extremity swelling Skin: Negative for rash. Neurological: Negative for severe headaches, focal weakness or numbness.   ____________________________________________   PHYSICAL EXAM:  VITAL SIGNS: ED Triage Vitals  Enc Vitals Group     BP 07/29/18 1057 (!) 145/80     Pulse Rate 07/29/18 1057 (!) 56     Resp 07/29/18 1057 20     Temp 07/29/18 1057 98.4 F (36.9 C)     Temp Source 07/29/18 1057 Oral     SpO2 07/29/18 1057 98 %     Weight 07/29/18 1058 158 lb (71.7 kg)     Height 07/29/18 1058 5\' 3"  (1.6 m)     Head Circumference --  Peak Flow --      Pain Score 07/29/18 1058 10     Pain Loc --      Pain Edu? --      Excl. in GC? --     Constitutional: Alert and oriented. Well appearing and in no acute distress. Eyes: Conjunctivae are normal Head: Atraumatic HEENT: No congestion/rhinnorhea. Mucous membranes are moist.  Oropharynx non-erythematous Neck:   Nontender with no meningismus, no masses, no stridor Cardiovascular: Normal rate, regular rhythm. Grossly normal heart sounds.  Good peripheral circulation. Respiratory: Normal respiratory effort.  No  retractions. Lungs CTAB. Abdominal: Soft and palpation right upper quadrant, no surgical signs. No distention. No guarding no rebound Back:  There is no focal tenderness or step off.  there is no midline tenderness there are no lesions noted. there is no CVA tenderness  Musculoskeletal: No lower extremity tenderness, no upper extremity tenderness. No joint effusions, no DVT signs strong distal pulses no edema Neurologic:  Normal speech and language. No gross focal neurologic deficits are appreciated.  Skin:  Skin is warm, dry and intact. No rash noted. Psychiatric: Mood and affect are normal. Speech and behavior are normal.  ____________________________________________   LABS (all labs ordered are listed, but only abnormal results are displayed)  Labs Reviewed  COMPREHENSIVE METABOLIC PANEL - Abnormal; Notable for the following components:      Result Value   Potassium 3.2 (*)    Glucose, Bld 128 (*)    Calcium 8.8 (*)    All other components within normal limits  LIPASE, BLOOD  CBC  URINALYSIS, COMPLETE (UACMP) WITH MICROSCOPIC  POC URINE PREG, ED  I-STAT BETA HCG BLOOD, ED ([facility], [facility], [facility] ONLY)    Pertinent labs  results that were available during my care of the patient were reviewed by me and considered in my medical decision making (see chart for details). ____________________________________________  EKG  I personally interpreted any EKGs ordered by me or triage Bradycardia rate 53 bpm normal axis no acute ST elevation or depression sinus rhythm ____________________________________________  RADIOLOGY  Pertinent labs & imaging results that were available during my care of the patient were reviewed by me and considered in my medical decision making (see chart for details). If possible, patient and/or family made aware of any abnormal findings.  No results found. ____________________________________________    PROCEDURES  Procedure(s) performed:  None  Procedures  Critical Care performed: None  ____________________________________________   INITIAL IMPRESSION / ASSESSMENT AND PLAN / ED COURSE  Pertinent labs & imaging results that were available during my care of the patient were reviewed by me and considered in my medical decision making (see chart for details).  Patient here with right upper quadrant abdominal pain, vomiting, and feeling generally poorly in the context of known gallstone disease.  Abdomen is nonsurgical but she does have right upper quadrant abdominal discomfort.  We will give her pain medications antiemetics, will check pregnancy, will obtain ultrasound to see if she is developed Coley cystitis and we will reassess.  ----------------------------------------- 3:31 PM on 07/29/2018 -----------------------------------------  Remarkable for what appears to be biliary colic with no evidence of cholecystitis, we have discussed with her outpatient versus inpatient care for this, she would prefer to be evaluated by surgery although I have explained her I cannot guarantee surgery will perform surgery on the weekend for biliary colic.  ----------------------------------------- 5:25 PM on 07/29/2018 -----------------------------------------  Comfortable at this time, I have talked to Dr. Tonna Boehringer of surgery who is on-call, and  we are awaiting them to come evaluate the patient.  He is in surgery at this time I explained the delay to family    ____________________________________________   FINAL CLINICAL IMPRESSION(S) / ED DIAGNOSES  Final diagnoses:  RUQ abdominal pain      This chart was dictated using voice recognition software.  Despite best efforts to proofread,  errors can occur which can change meaning.      Jeanmarie Plant, MD 07/29/18 1344    Jeanmarie Plant, MD 07/29/18 1726

## 2018-07-29 NOTE — ED Notes (Signed)
Pt provided meal tray at this time.

## 2018-07-29 NOTE — ED Notes (Signed)
ED TO INPATIENT HANDOFF REPORT  Name/Age/Gender Elizabeth Frey 28 y.o. female  Code Status Code Status History    Date Active Date Inactive Code Status Order ID Comments User Context   09/24/2017 1730 09/26/2017 1615 Full Code [id number]  Purcell NailsShambley, Melody N, CNM Inpatient   09/24/2017 1606 09/24/2017 1730 Full Code [id number]  Purcell NailsShambley, Melody N, CNM Inpatient   09/24/2017 0141 09/24/2017 1334 Full Code [id number]  Sharren Bridgeevallos, Kristen R, RN Inpatient      Home/SNF/Other Home  Chief Complaint EMS  Level of Care/Admitting Diagnosis ED Disposition    ED Disposition Condition Comment   Admit  Hospital Area: [facility] [[id number]]  Level of Care: Med-Surg [16]  Diagnosis: Biliary colic [[id number]][[id number]]  Admitting Physician: Sung AmabileSAKAI, ISAMI [[id number]]  Attending Physician: Sung AmabileSAKAI, ISAMI [[id number]]  Estimated length of stay: past midnight tomorrow  Certification:: I certify this patient will need inpatient services for at least 2 midnights  PT Class (Do Not Modify): Inpatient [101]  PT Acc Code (Do Not Modify): Private [1]       Medical History Past Medical History:  Diagnosis Date  . Gallstones   . Headache(784.0)    non-migraine; every 1-2 days  . 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    Allergies No Known Allergies  IV Location/Drains/Wounds Patient Lines/Drains/Airways Status   Active Line/Drains/Airways    Name:   Placement date:   Placement time:   Site:   Days:   Peripheral IV 07/29/18 Left Antecubital   07/29/18    1348    Antecubital   less than 1          Labs/Imaging Results for orders placed or performed during the hospital encounter of 07/29/18 (from the past 48 hour(s))  Lipase, blood     Status: None   Collection Time: 07/29/18 11:07 AM  Result Value Ref Range   Lipase 29 11 - 51 U/L    Comment: Performed at [facility], [address] [place], [place] [address]  Comprehensive metabolic panel     Status: Abnormal   Collection Time: 07/29/18 11:07  AM  Result Value Ref Range   Sodium 139 135 - 145 mmol/L   Potassium 3.2 (L) 3.5 - 5.1 mmol/L   Chloride 111 98 - 111 mmol/L   CO2 22 22 - 32 mmol/L   Glucose, Bld 128 (H) 70 - 99 mg/dL   BUN 11 6 - 20 mg/dL   Creatinine, Ser 7.250.87 0.44 - 1.00 mg/dL   Calcium 8.8 (L) 8.9 - 10.3 mg/dL   Total Protein 8.0 6.5 - 8.1 g/dL   Albumin 3.9 3.5 - 5.0 g/dL   AST 21 15 - 41 U/L   ALT 25 0 - 44 U/L   Alkaline Phosphatase 74 38 - 126 U/L   Total Bilirubin 0.7 0.3 - 1.2 mg/dL   GFR calc non Af Amer >60 >60 mL/min   GFR calc Af Amer >60 >60 mL/min   Anion gap 6 5 - 15    Comment: Performed at [facility], [address] [place], [place] [address]  CBC     Status: None   Collection Time: 07/29/18 11:07 AM  Result Value Ref Range   WBC 9.2 4.0 - 10.5 K/uL   RBC 4.20 3.87 - 5.11 MIL/uL   Hemoglobin 13.1 12.0 - 15.0 g/dL   HCT 03.439.9 74.236.0 - 59.546.0 %   MCV 95.0 80.0 - 100.0 fL   MCH 31.2 26.0 - 34.0 pg   MCHC 32.8 30.0 - 36.0  g/dL   RDW 48.1 85.6 - 31.4 %   Platelets 218 150 - 400 K/uL   nRBC 0.0 0.0 - 0.2 %    Comment: Performed at [facility], [address] [place], [place] [address]  Urinalysis, Complete w Microscopic     Status: Abnormal   Collection Time: 07/29/18 11:07 AM  Result Value Ref Range   Color, Urine YELLOW (A) YELLOW   APPearance HAZY (A) CLEAR   Specific Gravity, Urine 1.017 1.005 - 1.030   pH 6.0 5.0 - 8.0   Glucose, UA NEGATIVE NEGATIVE mg/dL   Hgb urine dipstick NEGATIVE NEGATIVE   Bilirubin Urine NEGATIVE NEGATIVE   Ketones, ur NEGATIVE NEGATIVE mg/dL   Protein, ur NEGATIVE NEGATIVE mg/dL   Nitrite NEGATIVE NEGATIVE   Leukocytes,Ua MODERATE (A) NEGATIVE   RBC / HPF 0-5 0 - 5 RBC/hpf   WBC, UA 6-10 0 - 5 WBC/hpf   Bacteria, UA RARE (A) NONE SEEN   Squamous Epithelial / LPF 6-10 0 - 5   Mucus PRESENT     Comment: Performed at [facility], [address] [place], [place] [address]  I-Stat beta hCG blood, ED     Status: None    Collection Time: 07/29/18  1:55 PM  Result Value Ref Range   I-stat hCG, quantitative <5.0 <5 mIU/mL   Comment 3            Comment:   GEST. AGE      CONC.  (mIU/mL)   <=1 WEEK        5 - 50     2 WEEKS       50 - 500     3 WEEKS       100 - 10,000     4 WEEKS     1,000 - 30,000        FEMALE AND NON-PREGNANT FEMALE:     LESS THAN 5 mIU/mL    US Abdomen Limited Ruq  Result Date: 07/29/2018 CLINICAL DATA:  Right upper quadrant abdominal pain for 1 day. EXAM: ULTRASOUND ABDOMEN LIMITED RIGHT UPPER QUADRANT COMPARISON:  Prior ultrasound 07/01/2018. FINDINGS: Gallbladder: Cholelithiasis again noted. There is no gallbladder wall thickening or sonographic Murphy's sign. Common bile duct: Diameter: 4 mm Liver: No focal lesion identified. Within normal limits in parenchymal echogenicity. Portal vein is patent on color Doppler imaging with normal direction of blood flow towards the liver. IMPRESSION: Stable examination compared with recent prior. Cholelithiasis without evidence of cholecystitis or biliary dilatation. Electronically Signed   By: Carey Bullocks M.D.   On: 07/29/2018 14:33    Pending Labs [employer] (From admission, onward)    Start     Ordered   Signed and Held  HIV antibody (Routine Testing)  Once,   R     Signed and Held   Signed and Interior and spatial designer  Daily,   R     Signed and Held   Signed and Held  Magnesium  Daily,   R     Signed and Held   Signed and Held  Phosphorus  Daily,   R     Signed and Held   Signed and Held  CBC  Daily,   R     Signed and Held   Signed and Held  Hepatic function panel  Daily,   R     Signed and Held          Production manager  07/29/18 1351 07/29/18 1418 07/29/18 1922 07/29/18 1955  BP:    (!) 136/91  Pulse:    72  Resp:    18  Temp:    98.7 F (37.1 C)  TempSrc:      SpO2:    99%  Weight:      Height:      PainSc: 10-Worst pain ever 8  4      Isolation Precautions No active  isolations  Medications Medications  ondansetron (ZOFRAN-ODT) disintegrating tablet 4 mg (4 mg Oral Given 07/29/18 1107)  morphine 4 MG/ML injection 4 mg (4 mg Intravenous Given 07/29/18 1350)  sodium chloride 0.9 % bolus 1,000 mL (0 mLs Intravenous Stopped 07/29/18 1816)  morphine 4 MG/ML injection 4 mg (4 mg Intravenous Given 07/29/18 1536)    Mobility walks

## 2018-07-29 NOTE — ED Notes (Signed)
MD at bedside. 

## 2018-07-29 NOTE — ED Notes (Signed)
Pt taken to US via stretcher

## 2018-07-29 NOTE — H&P (Signed)
Subjective:   CC: Biliary colic  HPI:  Elizabeth Frey is a 28 y.o. female who is consulted by [facility] for evaluation of above cc.  Symptoms were first noted several hours ago. Pain is sharp, localized to the right upper quadrant.  Patient had a similar episode about a month ago.  Diagnosed with biliary colic and was to have follow-up but never was able to schedule an appointment with surgeon.  This episodes pain associated with nothing specific, exacerbated by nothing specific.     Past Medical History:  has a past medical history of Gallstones, Headache(784.0), and Macromastia (08/2011).  Past Surgical History:  has a past surgical history that includes Wisdom tooth extraction; Dilation and curettage of uterus (2008); Breast reduction surgery (08/24/2011); Cesarean section; and Breast surgery.  Family History: Reviewed and not relevant to CC  Social History:  reports that she has never smoked. She has never used smokeless tobacco. She reports that she does not drink alcohol or use drugs.  Current Medications:  Medications Prior to Admission  Medication Sig Dispense Refill  . medroxyPROGESTERone (DEPO-PROVERA) 150 MG/ML injection Inject 150 mg into the muscle every 3 (three) months.    . traMADol (ULTRAM) 50 MG tablet Take 1 tablet (50 mg total) by mouth every 6 (six) hours as needed. (Patient not taking: Reported on 07/29/2018) 12 tablet 0    Allergies:  Allergies as of 07/29/2018  . (No Known Allergies)    ROS:  General: Denies weight loss, weight gain, fatigue, fevers, chills, and night sweats. Eyes: Denies blurry vision, double vision, eye pain, itchy eyes, and tearing. Ears: Denies hearing loss, earache, and ringing in ears. Nose: Denies sinus pain, congestion, infections, runny nose, and nosebleeds. Mouth/throat: Denies hoarseness, sore throat, bleeding gums, and difficulty swallowing. Heart: Denies chest pain, palpitations, racing heart, irregular heartbeat, leg pain or  swelling, and decreased activity tolerance. Respiratory: Denies breathing difficulty, shortness of breath, wheezing, cough, and sputum. GI: Denies change in appetite, heartburn, nausea, vomiting, constipation, diarrhea, and blood in stool. GU: Denies difficulty urinating, pain with urinating, urgency, frequency, blood in urine. Musculoskeletal: Denies joint stiffness, pain, swelling, muscle weakness. Skin: Denies rash, itching, mass, tumors, sores, and boils Neurologic: Denies headache, fainting, dizziness, seizures, numbness, and tingling. Psychiatric: Denies depression, anxiety, difficulty sleeping, and memory loss. Endocrine: Denies heat or cold intolerance, and increased thirst or urination. Blood/lymph: Denies easy bruising, easy bruising, and swollen glands    Objective:     BP (!) 136/91   Pulse 72   Temp 98.7 F (37.1 C)   Resp 18   Ht 5\' 3"  (1.6 m)   Wt 71.7 kg   SpO2 99%   BMI 27.99 kg/m    Constitutional :  alert, cooperative and appears stated age  Lymphatics/Throat:  no asymmetry, masses, or scars  Respiratory:  clear to auscultation bilaterally  Cardiovascular:  regular rate and rhythm  Gastrointestinal: Soft, no guarding, but focal tenderness noted in the right upper quadrant.   Musculoskeletal: Steady gait and movement  Skin: Cool and moist  Psychiatric: Normal affect, non-agitated, not confused       LABS:  CMP Latest Ref Rng & Units 07/29/2018 07/01/2018 07/29/2017  Glucose 70 - 99 mg/dL 728(V) 84 88  BUN 6 - 20 mg/dL 11 13 6   Creatinine 0.44 - 1.00 mg/dL 7.91 5.04 1.36  Sodium 135 - 145 mmol/L 139 138 139  Potassium 3.5 - 5.1 mmol/L 3.2(L) 3.6 3.5  Chloride 98 - 111 mmol/L 111 109 104  CO2 22 - 32 mmol/L 22 23 21   Calcium 8.9 - 10.3 mg/dL 8.1(E) 7.5(T) 7.0(Y)  Total Protein 6.5 - 8.1 g/dL 8.0 7.9 6.7  Total Bilirubin 0.3 - 1.2 mg/dL 0.7 0.8 0.4  Alkaline Phos 38 - 126 U/L 74 81 116  AST 15 - 41 U/L 21 18 14   ALT 0 - 44 U/L 25 22 24    CBC Latest Ref  Rng & Units 07/29/2018 07/01/2018 11/04/2017  WBC 4.0 - 10.5 K/uL 9.2 8.6 7.1  Hemoglobin 12.0 - 15.0 g/dL 17.4 94.4 96.7  Hematocrit 36.0 - 46.0 % 39.9 37.8 40.5  Platelets 150 - 400 K/uL 218 174 244     RADS: CLINICAL DATA:  Right upper quadrant abdominal pain for 1 day.  EXAM: ULTRASOUND ABDOMEN LIMITED RIGHT UPPER QUADRANT  COMPARISON:  Prior ultrasound 07/01/2018.  FINDINGS: Gallbladder:  Cholelithiasis again noted. There is no gallbladder wall thickening or sonographic Murphy's sign.  Common bile duct:  Diameter: 4 mm  Liver:  No focal lesion identified. Within normal limits in parenchymal echogenicity. Portal vein is patent on color Doppler imaging with normal direction of blood flow towards the liver.  IMPRESSION: Stable examination compared with recent prior. Cholelithiasis without evidence of cholecystitis or biliary dilatation.   Electronically Signed   By: Carey Bullocks M.D.   On: 07/29/2018 14:33 Assessment:      Biliary colic with cholelithiasis noted on ultrasound but no signs of acute cholecystitis  Plan:      Discussed the risk of surgery including post-op infxn, seroma, biloma, chronic pain, poor-delayed wound healing, retained gallstone, conversion to open procedure, post-op SBO or ileus, and need for additional procedures to address said risks.  The risks of general anesthetic including MI, CVA, sudden death or even reaction to anesthetic medications also discussed. Alternatives include continued observation.  Benefits include possible symptom relief, prevention of complications including acute cholecystitis, pancreatitis.  Typical post operative recovery of 3-5 days rest, continued pain in area and incision sites, possible loose stools up to 4-6 weeks, also discussed.  The patient understands the risks, any and all questions were answered to the patient's satisfaction.  Patient requested that we proceed with admission and interval  lap chole tomorrow, due to the fact that this has been her second admission to the emergency department for the same symptoms.  Despite the absence of acute cholecystitis noted on imaging as well as a normal white count, I believe this is a appropriate option for her at this time.   Admit to floor, n.p.o. after midnight, IV antibiotics lap chole in a.m.

## 2018-07-29 NOTE — ED Notes (Signed)
Pt ambulatory to BR at this time with steady gait and in NAD.  

## 2018-07-30 ENCOUNTER — Inpatient Hospital Stay: Payer: Medicaid Other | Admitting: Anesthesiology

## 2018-07-30 ENCOUNTER — Encounter
Admission: EM | Disposition: A | Discharge status: Home / Self Care | Payer: Self-pay | Source: Home / Self Care | Attending: Surgery

## 2018-07-30 ENCOUNTER — Encounter: Payer: Self-pay | Admitting: Anesthesiology

## 2018-07-30 HISTORY — PX: CHOLECYSTECTOMY: SHX55

## 2018-07-30 LAB — BASIC METABOLIC PANEL
Anion gap: 6 (ref 5–15)
BUN: 8 mg/dL (ref 6–20)
CO2: 22 mmol/L (ref 22–32)
Calcium: 8.3 mg/dL — ABNORMAL LOW (ref 8.9–10.3)
Chloride: 112 mmol/L — ABNORMAL HIGH (ref 98–111)
Creatinine, Ser: 0.73 mg/dL (ref 0.44–1.00)
GFR calc Af Amer: >60 mL/min (ref >60)
GFR calc non Af Amer: >60 mL/min (ref >60)
Glucose, Bld: 90 mg/dL (ref 70–99)
Potassium: 3.1 mmol/L — ABNORMAL LOW (ref 3.5–5.1)
Sodium: 140 mmol/L (ref 135–145)

## 2018-07-30 LAB — CBC
HCT: 36.1 % (ref 36.0–46.0)
Hemoglobin: 11.9 g/dL — ABNORMAL LOW (ref 12.0–15.0)
MCH: 30.7 pg (ref 26.0–34.0)
MCHC: 33 g/dL (ref 30.0–36.0)
MCV: 93.3 fL (ref 80.0–100.0)
Platelets: 187 10*3/uL (ref 150–400)
RBC: 3.87 MIL/uL (ref 3.87–5.11)
RDW: 12.8 % (ref 11.5–15.5)
WBC: 8 10*3/uL (ref 4.0–10.5)
nRBC: 0 % (ref 0.0–0.2)

## 2018-07-30 LAB — POCT PREGNANCY, URINE: Preg Test, Ur: NEGATIVE

## 2018-07-30 LAB — HEPATIC FUNCTION PANEL
ALT: 20 U/L (ref 0–44)
AST: 19 U/L (ref 15–41)
Albumin: 3.3 g/dL — ABNORMAL LOW (ref 3.5–5.0)
Alkaline Phosphatase: 61 U/L (ref 38–126)
Bilirubin, Direct: <0.1 mg/dL (ref 0.0–0.2)
Total Bilirubin: 1.1 mg/dL (ref 0.3–1.2)
Total Protein: 6.7 g/dL (ref 6.5–8.1)

## 2018-07-30 LAB — PHOSPHORUS: Phosphorus: 4 mg/dL (ref 2.5–4.6)

## 2018-07-30 LAB — SURGICAL PCR SCREEN
MRSA, PCR: NEGATIVE
Staphylococcus aureus: NEGATIVE

## 2018-07-30 LAB — MAGNESIUM: Magnesium: 2 mg/dL (ref 1.7–2.4)

## 2018-07-30 SURGERY — LAPAROSCOPIC CHOLECYSTECTOMY
Anesthesia: General

## 2018-07-30 MED ORDER — ACETAMINOPHEN 10 MG/ML IV SOLN
INTRAVENOUS | Status: AC
  Filled 2018-07-30: qty 100

## 2018-07-30 MED ORDER — KETOROLAC TROMETHAMINE 30 MG/ML IJ SOLN
INTRAMUSCULAR | Status: DC | PRN
  Administered 2018-07-30: 30 mg via INTRAVENOUS

## 2018-07-30 MED ORDER — SUCCINYLCHOLINE CHLORIDE 20 MG/ML IJ SOLN
INTRAMUSCULAR | Status: AC
  Filled 2018-07-30: qty 1

## 2018-07-30 MED ORDER — FENTANYL CITRATE (PF) 100 MCG/2ML IJ SOLN
INTRAMUSCULAR | Status: DC | PRN
  Administered 2018-07-30 (×4): 50 ug via INTRAVENOUS

## 2018-07-30 MED ORDER — LIDOCAINE-EPINEPHRINE 1 %-1:100000 IJ SOLN
INTRAMUSCULAR | Status: AC
  Filled 2018-07-30: qty 2

## 2018-07-30 MED ORDER — ROCURONIUM BROMIDE 50 MG/5ML IV SOLN
INTRAVENOUS | Status: AC
  Filled 2018-07-30: qty 1

## 2018-07-30 MED ORDER — MIDAZOLAM HCL 5 MG/5ML IJ SOLN
INTRAMUSCULAR | Status: DC | PRN
  Administered 2018-07-30: 2 mg via INTRAVENOUS

## 2018-07-30 MED ORDER — SUGAMMADEX SODIUM 200 MG/2ML IV SOLN
INTRAVENOUS | Status: AC
  Filled 2018-07-30: qty 2

## 2018-07-30 MED ORDER — LIDOCAINE HCL (CARDIAC) PF 100 MG/5ML IV SOSY
PREFILLED_SYRINGE | INTRAVENOUS | Status: DC | PRN
  Administered 2018-07-30: 60 mg via INTRAVENOUS

## 2018-07-30 MED ORDER — ACETAMINOPHEN 650 MG RE SUPP: 650.0000 mg | Freq: Four times a day (QID) | RECTAL | Status: DC | PRN

## 2018-07-30 MED ORDER — SUCCINYLCHOLINE CHLORIDE 20 MG/ML IJ SOLN
INTRAMUSCULAR | Status: DC | PRN
  Administered 2018-07-30: 100 mg via INTRAVENOUS

## 2018-07-30 MED ORDER — LIDOCAINE HCL (PF) 2 % IJ SOLN
INTRAMUSCULAR | Status: AC
  Filled 2018-07-30: qty 10

## 2018-07-30 MED ORDER — HYDROCODONE-ACETAMINOPHEN 5-325 MG PO TABS
1.0000 | ORAL_TABLET | ORAL | Status: DC | PRN
  Administered 2018-07-30 – 2018-07-31 (×4): 2 via ORAL
  Filled 2018-07-30 (×4): qty 2

## 2018-07-30 MED ORDER — ONDANSETRON HCL 4 MG/2ML IJ SOLN
INTRAMUSCULAR | Status: DC | PRN
  Administered 2018-07-30: 4 mg via INTRAVENOUS

## 2018-07-30 MED ORDER — DEXAMETHASONE SODIUM PHOSPHATE 10 MG/ML IJ SOLN
INTRAMUSCULAR | Status: DC | PRN
  Administered 2018-07-30: 5 mg via INTRAVENOUS

## 2018-07-30 MED ORDER — KETOROLAC TROMETHAMINE 30 MG/ML IJ SOLN
INTRAMUSCULAR | Status: AC
  Filled 2018-07-30: qty 1

## 2018-07-30 MED ORDER — FENTANYL CITRATE (PF) 100 MCG/2ML IJ SOLN
INTRAMUSCULAR | Status: AC
  Filled 2018-07-30: qty 2

## 2018-07-30 MED ORDER — ONDANSETRON 4 MG PO TBDP: 4.0000 mg | ORAL_TABLET | Freq: Four times a day (QID) | ORAL | Status: DC | PRN

## 2018-07-30 MED ORDER — MORPHINE SULFATE (PF) 2 MG/ML IV SOLN
1.0000 mg | INTRAVENOUS | Status: DC | PRN
  Administered 2018-07-30 (×2): 1 mg via INTRAVENOUS
  Filled 2018-07-30 (×2): qty 1

## 2018-07-30 MED ORDER — ACETAMINOPHEN 10 MG/ML IV SOLN
INTRAVENOUS | Status: DC | PRN
  Administered 2018-07-30: 1000 mg via INTRAVENOUS

## 2018-07-30 MED ORDER — PROPOFOL 10 MG/ML IV BOLUS
INTRAVENOUS | Status: AC
  Filled 2018-07-30: qty 20

## 2018-07-30 MED ORDER — DEXAMETHASONE SODIUM PHOSPHATE 10 MG/ML IJ SOLN
INTRAMUSCULAR | Status: AC
  Filled 2018-07-30: qty 1

## 2018-07-30 MED ORDER — LIDOCAINE-EPINEPHRINE (PF) 1 %-1:200000 IJ SOLN
INTRAMUSCULAR | Status: DC | PRN
  Administered 2018-07-30: 30 mL

## 2018-07-30 MED ORDER — HYDROMORPHONE HCL 1 MG/ML IJ SOLN
INTRAMUSCULAR | Status: DC | PRN
  Administered 2018-07-30: .2 mg via INTRAVENOUS

## 2018-07-30 MED ORDER — TRAMADOL HCL 50 MG PO TABS: 50.0000 mg | ORAL_TABLET | Freq: Four times a day (QID) | ORAL | Status: DC | PRN

## 2018-07-30 MED ORDER — ACETAMINOPHEN 325 MG PO TABS: 650.0000 mg | ORAL_TABLET | Freq: Four times a day (QID) | ORAL | Status: DC | PRN

## 2018-07-30 MED ORDER — MIDAZOLAM HCL 2 MG/2ML IJ SOLN
INTRAMUSCULAR | Status: AC
  Filled 2018-07-30: qty 2

## 2018-07-30 MED ORDER — SUGAMMADEX SODIUM 200 MG/2ML IV SOLN
INTRAVENOUS | Status: DC | PRN
  Administered 2018-07-30: 150 mg via INTRAVENOUS

## 2018-07-30 MED ORDER — PROPOFOL 10 MG/ML IV BOLUS
INTRAVENOUS | Status: DC | PRN
  Administered 2018-07-30: 150 mg via INTRAVENOUS

## 2018-07-30 MED ORDER — BUPIVACAINE HCL (PF) 0.5 % IJ SOLN
INTRAMUSCULAR | Status: AC
  Filled 2018-07-30: qty 30

## 2018-07-30 MED ORDER — ONDANSETRON HCL 4 MG/2ML IJ SOLN
INTRAMUSCULAR | Status: AC
  Filled 2018-07-30: qty 2

## 2018-07-30 MED ORDER — IBUPROFEN 600 MG PO TABS
600.0000 mg | ORAL_TABLET | Freq: Four times a day (QID) | ORAL | Status: DC | PRN
  Filled 2018-07-30: qty 1

## 2018-07-30 MED ORDER — FENTANYL CITRATE (PF) 100 MCG/2ML IJ SOLN
25.0000 ug | INTRAMUSCULAR | Status: DC | PRN
  Administered 2018-07-30 (×4): 25 ug via INTRAVENOUS

## 2018-07-30 MED ORDER — ROCURONIUM BROMIDE 100 MG/10ML IV SOLN
INTRAVENOUS | Status: DC | PRN
  Administered 2018-07-30: 25 mg via INTRAVENOUS
  Administered 2018-07-30: 5 mg via INTRAVENOUS

## 2018-07-30 MED ORDER — ONDANSETRON HCL 4 MG/2ML IJ SOLN: 4.0000 mg | Freq: Four times a day (QID) | INTRAMUSCULAR | Status: DC | PRN

## 2018-07-30 MED ORDER — HYDROMORPHONE HCL 1 MG/ML IJ SOLN
INTRAMUSCULAR | Status: AC
  Filled 2018-07-30: qty 1

## 2018-07-30 MED ORDER — ONDANSETRON HCL 4 MG/2ML IJ SOLN
4.0000 mg | Freq: Once | INTRAMUSCULAR | Status: AC | PRN
  Administered 2018-07-30: 4 mg via INTRAVENOUS

## 2018-07-30 MED ORDER — DEXMEDETOMIDINE HCL 200 MCG/2ML IV SOLN
INTRAVENOUS | Status: DC | PRN
  Administered 2018-07-30: 8 ug via INTRAVENOUS

## 2018-07-30 MED ORDER — POTASSIUM CHLORIDE CRYS ER 20 MEQ PO TBCR
30.0000 meq | EXTENDED_RELEASE_TABLET | Freq: Once | ORAL | Status: AC
  Administered 2018-07-30: 30 meq via ORAL
  Filled 2018-07-30: qty 1

## 2018-07-30 SURGICAL SUPPLY — 54 items
ANCHOR TIS RET SYS 235ML (MISCELLANEOUS) IMPLANT
APPLIER CLIP 5 13 M/L LIGAMAX5 (MISCELLANEOUS) ×3
BLADE SURG SZ11 CARB STEEL (BLADE) ×3 IMPLANT
CANISTER SUCT 1200ML W/VALVE (MISCELLANEOUS) ×3 IMPLANT
CHLORAPREP W/TINT 26ML (MISCELLANEOUS) ×3 IMPLANT
CHOLANGIOGRAM CATH TAUT (CATHETERS) IMPLANT
CLIP APPLIE 5 13 M/L LIGAMAX5 (MISCELLANEOUS) ×1 IMPLANT
COVER WAND RF STERILE (DRAPES) ×3 IMPLANT
DECANTER SPIKE VIAL GLASS SM (MISCELLANEOUS) ×6 IMPLANT
DEFOGGER SCOPE WARMER CLEARIFY (MISCELLANEOUS) IMPLANT
DERMABOND ADVANCED (GAUZE/BANDAGES/DRESSINGS) ×2
DERMABOND ADVANCED .7 DNX12 (GAUZE/BANDAGES/DRESSINGS) ×1 IMPLANT
DISSECTOR BLUNT TIP ENDO 5MM (MISCELLANEOUS) IMPLANT
DISSECTOR KITTNER STICK (MISCELLANEOUS) IMPLANT
DISSECTORS/KITTNER STICK (MISCELLANEOUS)
DRAPE C-ARM XRAY 36X54 (DRAPES) IMPLANT
DRAPE SHEET LG 3/4 BI-LAMINATE (DRAPES) IMPLANT
ELECT CAUTERY BLADE 6.4 (BLADE) IMPLANT
ELECT REM PT RETURN 9FT ADLT (ELECTROSURGICAL) ×3
ELECTRODE REM PT RTRN 9FT ADLT (ELECTROSURGICAL) ×1 IMPLANT
GLOVE BIOGEL PI IND STRL 7.0 (GLOVE) ×1 IMPLANT
GLOVE BIOGEL PI INDICATOR 7.0 (GLOVE) ×2
GLOVE SURG SYN 7.0 (GLOVE) ×9 IMPLANT
GOWN STRL REUS W/ TWL LRG LVL3 (GOWN DISPOSABLE) ×2 IMPLANT
GOWN STRL REUS W/TWL LRG LVL3 (GOWN DISPOSABLE) ×4
GRASPER SUT TROCAR 14GX15 (MISCELLANEOUS) ×3 IMPLANT
IRRIGATION STRYKERFLOW (MISCELLANEOUS) IMPLANT
IRRIGATOR STRYKERFLOW (MISCELLANEOUS)
IV CATH ANGIO 12GX3 LT BLUE (NEEDLE) IMPLANT
IV NS 1000ML (IV SOLUTION)
IV NS 1000ML BAXH (IV SOLUTION) IMPLANT
JACKSON PRATT 10 (INSTRUMENTS) IMPLANT
L-HOOK LAP DISP 36CM (ELECTROSURGICAL) ×3
LHOOK LAP DISP 36CM (ELECTROSURGICAL) ×1 IMPLANT
NEEDLE HYPO 22GX1.5 SAFETY (NEEDLE) ×3 IMPLANT
PACK LAP CHOLECYSTECTOMY (MISCELLANEOUS) ×3 IMPLANT
PENCIL ELECTRO HAND CTR (MISCELLANEOUS) ×3 IMPLANT
PORT ACCESS TROCAR AIRSEAL 5 (TROCAR) ×3 IMPLANT
SCISSORS METZENBAUM CVD 33 (INSTRUMENTS) ×3 IMPLANT
SET TRI-LUMEN FLTR TB AIRSEAL (TUBING) ×3 IMPLANT
SLEEVE ENDOPATH XCEL 5M (ENDOMECHANICALS) ×3 IMPLANT
SPONGE LAP 18X18 RF (DISPOSABLE) IMPLANT
STOPCOCK 4 WAY LG BORE MALE ST (IV SETS) IMPLANT
SUT MNCRL 4-0 (SUTURE) ×2
SUT MNCRL 4-0 27XMFL (SUTURE) ×1
SUT VIC AB 3-0 SH 27 (SUTURE)
SUT VIC AB 3-0 SH 27X BRD (SUTURE) IMPLANT
SUT VICRYL 0 AB UR-6 (SUTURE) ×6 IMPLANT
SUTURE MNCRL 4-0 27XMF (SUTURE) ×1 IMPLANT
SYR 20CC LL (SYRINGE) ×3 IMPLANT
TOWEL OR 17X26 4PK STRL BLUE (TOWEL DISPOSABLE) ×3 IMPLANT
TROCAR XCEL BLUNT TIP 100MML (ENDOMECHANICALS) ×3 IMPLANT
TROCAR XCEL NON-BLD 5MMX100MML (ENDOMECHANICALS) ×3 IMPLANT
WATER STERILE IRR 1000ML POUR (IV SOLUTION) ×3 IMPLANT

## 2018-07-30 NOTE — Anesthesia Preprocedure Evaluation (Signed)
Anesthesia Evaluation  Patient identified by MRN, date of birth, ID band Patient awake    Reviewed: Allergy & Precautions, H&P , NPO status , Patient's Chart, lab work & pertinent test results  Airway Mallampati: II  TM Distance: >3 FB Neck ROM: Full    Dental  (+) Teeth Intact   Pulmonary    breath sounds clear to auscultation       Cardiovascular negative cardio ROS   Rhythm:Regular Rate:Normal     Neuro/Psych  Headaches, negative psych ROS   GI/Hepatic Neg liver ROS,   Endo/Other  negative endocrine ROS  Renal/GU negative Renal ROS  negative genitourinary   Musculoskeletal negative musculoskeletal ROS (+)   Abdominal   Peds negative pediatric ROS (+)  Hematology negative hematology ROS (+)   Anesthesia Other Findings   Reproductive/Obstetrics                             Anesthesia Physical  Anesthesia Plan  ASA: I  Anesthesia Plan: General   Post-op Pain Management:    Induction: Intravenous  PONV Risk Score and Plan:   Airway Management Planned: Oral ETT  Additional Equipment:   Intra-op Plan:   Post-operative Plan: Extubation in OR  Informed Consent:     Dental advisory given  Plan Discussed with: CRNA and Surgeon  Anesthesia Plan Comments: (ASA 1  Plan GA with ETT  Kipp Brood, MD)        Anesthesia Quick Evaluation

## 2018-07-30 NOTE — Anesthesia Postprocedure Evaluation (Signed)
Anesthesia Post Note  Patient: Elizabeth Frey  Procedure(s) Performed: LAPAROSCOPIC CHOLECYSTECTOMY (N/A )  Patient location during evaluation: PACU Anesthesia Type: General Level of consciousness: awake and alert and oriented Pain management: pain level controlled Vital Signs Assessment: post-procedure vital signs reviewed and stable Respiratory status: spontaneous breathing Cardiovascular status: blood pressure returned to baseline Anesthetic complications: no     Last Vitals:  Vitals:   07/30/18 1234 07/30/18 1310  BP: 130/80 116/81  Pulse: 75 70  Resp: 20 (!) 22  Temp:  36.8 C  SpO2: 100% 100%    Last Pain:  Vitals:   07/30/18 1408  TempSrc:   PainSc: Asleep                 Meshach Perry

## 2018-07-30 NOTE — Transfer of Care (Signed)
Immediate Anesthesia Transfer of Care Note  Patient: Elizabeth Frey  Procedure(s) Performed: LAPAROSCOPIC CHOLECYSTECTOMY (N/A )  Patient Location: PACU  Anesthesia Type:General  Level of Consciousness: sedated  Airway & Oxygen Therapy: Patient Spontanous Breathing and Patient connected to face mask oxygen  Post-op Assessment: Report given to RN and Post -op Vital signs reviewed and stable  Post vital signs: Reviewed and stable  Last Vitals:  Vitals Value Taken Time  BP 140/82 07/30/2018 11:48 AM  Temp 36.4 C 07/30/2018 11:48 AM  Pulse 87 07/30/2018 11:52 AM  Resp 25 07/30/2018 11:52 AM  SpO2 100 % 07/30/2018 11:52 AM  Vitals shown include unvalidated device data.  Last Pain:  Vitals:   07/30/18 1148  TempSrc:   PainSc: Asleep         Complications: No apparent anesthesia complications

## 2018-07-30 NOTE — Anesthesia Procedure Notes (Signed)
Procedure Name: Intubation Date/Time: 07/30/2018 10:19 AM Performed by: Dionne Bucy, CRNA Pre-anesthesia Checklist: Patient identified, Patient being monitored, Timeout performed, Emergency Drugs available and Suction available Patient Re-evaluated:Patient Re-evaluated prior to induction Oxygen Delivery Method: Circle system utilized Preoxygenation: Pre-oxygenation with 100% oxygen Induction Type: IV induction Ventilation: Mask ventilation without difficulty Laryngoscope Size: Mac and 3 Grade View: Grade I Tube type: Oral Tube size: 7.0 mm Number of attempts: 1 Airway Equipment and Method: Stylet Placement Confirmation: ETT inserted through vocal cords under direct vision,  positive ETCO2 and breath sounds checked- equal and bilateral Secured at: 21 cm Tube secured with: Tape Dental Injury: Teeth and Oropharynx as per pre-operative assessment

## 2018-07-30 NOTE — Anesthesia Post-op Follow-up Note (Signed)
Anesthesia QCDR form completed.        

## 2018-07-30 NOTE — Op Note (Signed)
Preoperative diagnosis:  chronic and cholecystitis  Postoperative diagnosis: same as above  Procedure: Laparoscopic Cholecystectomy.   Anesthesia: GETA   Surgeon: Sung Amabile  Specimen: Gallbladder  Complications: None  EBL: 78mL  Wound Classification: Clean Contaminated  Indications: see HPI  Findings: Critical view of safety noted Cystic duct and artery identified, ligated and divided, clips remained intact at end of procedure Adequate hemostasis  Description of procedure: The patient was placed on the operating table in the supine position. SCDs placed, pre-op abx administered.  General anesthesia was induced and OG tube placed by anesthesia. A time-out was completed verifying correct patient, procedure, site, positioning, and implant(s) and/or special equipment prior to beginning this procedure. The abdomen was prepped and draped in the usual sterile fashion.  An incision was made in a natural skin line under the umbilicus.  Dissection carried down to fascia where two 0 vicryl sutures placed to use as anchor sutures for hasson port.  Incision made into fascia and blunt dissection used to enter peritoneum.  Hasson port placed and insufflation started up to 65mm Hg without any dramatic increase in pressure.    The laparoscope was inserted and the abdomen inspected. No injuries from initial trocar placement were noted. Additional trocars were then inserted under direct visualization in the following locations: a 5-mm trocar in the subxyphoid region and two 5-mm trocars along the right costal margin. The abdomen was inspected and no abnormalities or injuries were found. The table was placed in the reverse Trendelenburg position with the right side up.  Filmy adhesions between the gallbladder and omentum, duodenum and transverse colon were lysed sharply. The dome of the gallbladder was grasped with an atraumatic grasper passed through the lateral port and retracted over the dome of the  liver. The infundibulum was also grasped with an atraumatic grasper and retracted toward the right lower quadrant. This maneuver exposed Calot's triangle. The peritoneum overlying the gallbladder infundibulum was then dissected and the cystic duct and cystic artery identified.  Critical view of safety with the liver bed clearly visible behind the duct and artery with no additional structures noted.  Picture taken before the cystic duct and cystic artery clipped and divided close to the gallbladder.  The gallbladder was then dissected from its peritoneal and liver bed attachments by electrocautery. Hemostasis was checked and the gallbladder was removed using an endoscopic retrieval bag placed through the umbilical port. The gallbladder was passed off the table as a specimen. The gallbladder fossa was copiously irrigated with saline and any leaked bile was suctioned out, and hemostasis was obtained. There was no evidence of bleeding from the gallbladder fossa or cystic artery or leakage of the bile from the cystic duct stump. The umbilical trocar removed and port site closed with PMI using 0 vicryl under direct vision.  Abdomen desufflated and secondary trocars were removed under direct vision. No bleeding was noted.  3-0 vicryl used to close deep dermal layer at umbilical site.  All skin incisions then closed with subcuticular sutures of 4-0 monocryl and dressed with topical skin adhesive. The orogastric tube was removed and patient extubated. The patient tolerated the procedure well and was taken to the postanesthesia care unit in stable condition.  All sponge and instrument count correct at end of procedure.

## 2018-07-31 ENCOUNTER — Encounter: Payer: Self-pay | Admitting: Surgery

## 2018-07-31 LAB — HIV ANTIBODY (ROUTINE TESTING W REFLEX): HIV Screen 4th Generation wRfx: NONREACTIVE

## 2018-07-31 MED ORDER — DOCUSATE SODIUM 100 MG PO CAPS: 100.0000 mg | ORAL_CAPSULE | Freq: Two times a day (BID) | ORAL | 0 refills | Status: AC | PRN

## 2018-07-31 MED ORDER — IBUPROFEN 800 MG PO TABS: 800.0000 mg | ORAL_TABLET | Freq: Three times a day (TID) | ORAL | 0 refills | Status: DC | PRN

## 2018-07-31 MED ORDER — HYDROCODONE-ACETAMINOPHEN 5-325 MG PO TABS: 1.0000 | ORAL_TABLET | Freq: Four times a day (QID) | ORAL | 0 refills | Status: AC | PRN

## 2018-07-31 MED ORDER — ACETAMINOPHEN 325 MG PO TABS: 650.0000 mg | ORAL_TABLET | Freq: Three times a day (TID) | ORAL | 0 refills | Status: AC | PRN

## 2018-07-31 NOTE — Care Management (Signed)
Patient provided with application to [facility] .  Ph[facility].  Patient provided goodrx coupon to [facility] for Norco rx.  Out of pocket cost $9.02

## 2018-07-31 NOTE — Discharge Summary (Signed)
Physician Discharge Summary  Patient ID: Elizabeth Frey MRN: [id number] DOB/AGE: 1991/02/26 28 y.o.  Admit date: 07/29/2018 Discharge date: 07/31/2018  Admission Diagnoses: biliary colic  Discharge Diagnoses:  Chronic cholecystitis  Discharged Condition: good  Hospital Course: dx with biliary colic during repeat visit to ED.  Underwent lap chole and recovered without any issues  Consults: None  Discharge Exam: Blood pressure 115/70, pulse 73, temperature 98.6 F (37 C), temperature source Oral, resp. rate 18, height 5\' 3"  (1.6 m), weight 74.7 kg, SpO2 100 %, not currently breastfeeding. General appearance: alert, cooperative and no distress GI: soft, appropriately tender along incision sites Incision/Wound: c/di  Disposition:  Discharge disposition: 01-Home or Self Care       Discharge Instructions    Discharge patient   Complete by:  As directed    Discharge disposition:  01-Home or Self Care   Discharge patient date:  07/31/2018     Allergies as of 07/31/2018   No Known Allergies     Medication List    STOP taking these medications   traMADol 50 MG tablet Commonly known as:  ULTRAM     TAKE these medications   acetaminophen 325 MG tablet Commonly known as:  TYLENOL Take 2 tablets (650 mg total) by mouth every 8 (eight) hours as needed for up to 30 days for mild pain.   docusate sodium 100 MG capsule Commonly known as:  COLACE Take 1 capsule (100 mg total) by mouth 2 (two) times daily as needed for up to 10 days for mild constipation.   HYDROcodone-acetaminophen 5-325 MG tablet Commonly known as:  NORCO Take 1 tablet by mouth every 6 (six) hours as needed for up to 3 days for moderate pain.   ibuprofen 800 MG tablet Commonly known as:  ADVIL,MOTRIN Take 1 tablet (800 mg total) by mouth every 8 (eight) hours as needed for mild pain or moderate pain.   medroxyPROGESTERone 150 MG/ML injection Commonly known as:  DEPO-PROVERA Inject 150 mg into  the muscle every 3 (three) months.      Follow-up Information    Tonna Boehringer, Janiaya Ryser, DO Follow up in 2 week(s).   Specialty:  Surgery Contact information: [address] [place] [address] [phone]            Total time spent arranging discharge was >27min. Signed: Sung Amabile 07/31/2018, 8:40 AM

## 2018-07-31 NOTE — Progress Notes (Signed)
July 31, 2018   Patient: Elizabeth Frey  Date of Birth: May 15, 1991  Date of Visit: 07/29/2018    To Whom It May Concern:  Ms. [name] underwent a surgical procedure requiring hospitalization from 2/15- 07/31/2018.  Please excuse her from work during those dates.   It is my medical opinion that [name] may return to full duty immediately with no restrictions on 08/03/2018.   If you have any questions or concerns, please don't hesitate to call.  Sincerely,  Sung Amabile, DO [address] [place], [place] [address] [phone]

## 2018-08-01 LAB — SURGICAL PATHOLOGY

## 2018-12-21 ENCOUNTER — Telehealth: Payer: Self-pay

## 2018-12-21 NOTE — Telephone Encounter (Signed)
TC with patient. Reports brown spotting (when she wipes) since last Depo on 11/17/18. Denies vaginal d/c, cramping/pain or odor; no change in partners. RN explained irregular spotting/bleeding can be normal for Depo however sometimes infections can cause BTB.  Offered STD screening for next week.  Patient scheduled a screening appt with next Depo on 02/02/19. Instructed to call [facility] if sx's worsen or new sx's arise. Patient verbalized understanding.

## 2019-01-29 ENCOUNTER — Other Ambulatory Visit: Payer: Self-pay

## 2019-01-29 ENCOUNTER — Ambulatory Visit (LOCAL_COMMUNITY_HEALTH_CENTER): Payer: Self-pay

## 2019-01-29 ENCOUNTER — Encounter: Payer: Self-pay | Admitting: Family Medicine

## 2019-01-29 VITALS — BP 116/76 | Ht 63.0 in | Wt 164.8 lb

## 2019-01-29 DIAGNOSIS — Z30013 Encounter for initial prescription of injectable contraceptive: Secondary | ICD-10-CM

## 2019-01-29 DIAGNOSIS — Z3009 Encounter for other general counseling and advice on contraception: Secondary | ICD-10-CM

## 2019-01-29 DIAGNOSIS — Z3042 Encounter for surveillance of injectable contraceptive: Secondary | ICD-10-CM

## 2019-01-29 MED ORDER — MEDROXYPROGESTERONE ACETATE 150 MG/ML IM SUSP
150.0000 mg | Freq: Once | INTRAMUSCULAR | Status: AC
  Administered 2019-01-29: 16:00:00 150 mg via INTRAMUSCULAR

## 2019-01-29 NOTE — Progress Notes (Signed)
In for Depo; denies c/o; per 03/24/18 Dr. Ernestina Patches order-adm. L. Glut-well tolerated Debera Lat, RN

## 2019-01-30 NOTE — Progress Notes (Signed)
Patient accidentally scheduled on provider schedule. RN only visit  I was available for consult as needed by RN

## 2019-02-02 ENCOUNTER — Ambulatory Visit: Payer: Self-pay

## 2019-03-22 ENCOUNTER — Other Ambulatory Visit: Payer: Self-pay | Admitting: Plastic Surgery

## 2019-03-22 ENCOUNTER — Ambulatory Visit: Admission: RE | Admit: 2019-03-22 | Payer: Self-pay | Source: Ambulatory Visit | Admitting: *Deleted

## 2019-03-22 DIAGNOSIS — Z01818 Encounter for other preprocedural examination: Secondary | ICD-10-CM

## 2019-03-27 ENCOUNTER — Ambulatory Visit
Admission: RE | Admit: 2019-03-27 | Discharge: 2019-03-27 | Disposition: A | Discharge status: Home / Self Care | Payer: Medicaid Other | Source: Ambulatory Visit | Attending: Plastic Surgery | Admitting: Plastic Surgery

## 2019-03-27 ENCOUNTER — Other Ambulatory Visit: Payer: Self-pay

## 2019-03-27 ENCOUNTER — Ambulatory Visit
Admission: RE | Admit: 2019-03-27 | Discharge: 2019-03-27 | Disposition: A | Discharge status: Home / Self Care | Payer: Medicaid Other | Source: Ambulatory Visit | Attending: *Deleted | Admitting: *Deleted

## 2019-03-27 DIAGNOSIS — Z01818 Encounter for other preprocedural examination: Secondary | ICD-10-CM | POA: Diagnosis not present

## 2019-05-13 ENCOUNTER — Emergency Department
Admission: EM | Admit: 2019-05-13 | Discharge: 2019-05-14 | Disposition: A | Discharge status: Home / Self Care | Payer: Medicaid Other | Attending: Emergency Medicine | Admitting: Emergency Medicine

## 2019-05-13 ENCOUNTER — Other Ambulatory Visit: Payer: Self-pay

## 2019-05-13 ENCOUNTER — Encounter: Payer: Self-pay | Admitting: Emergency Medicine

## 2019-05-13 DIAGNOSIS — Z87891 Personal history of nicotine dependence: Secondary | ICD-10-CM | POA: Diagnosis not present

## 2019-05-13 DIAGNOSIS — R7989 Other specified abnormal findings of blood chemistry: Secondary | ICD-10-CM | POA: Diagnosis not present

## 2019-05-13 DIAGNOSIS — R2243 Localized swelling, mass and lump, lower limb, bilateral: Secondary | ICD-10-CM | POA: Insufficient documentation

## 2019-05-13 DIAGNOSIS — R609 Edema, unspecified: Secondary | ICD-10-CM

## 2019-05-13 NOTE — ED Notes (Signed)
This RN introduced self to pt. Pt states she had liposuction done 11 days ago and has since noticed increased bilateral swelling and tightness in legs. Pt denies abdominal pain, SOB, CP, numbness and tingling. Pt states she was sent home on blood thinners but has finished the prescribe amount. Pt states she has also been wearing compression socks. Pt A&Ox4. Pt in NAD and VSS. Warm blanket given. Pt denies any needs at this time. Will continue to monitor.

## 2019-05-13 NOTE — ED Triage Notes (Signed)
Patient states that she had inner thigh liposuction done 11 days ago. Patient states that she had has swelling since her surgery but the swelling increased on Friday.

## 2019-05-14 ENCOUNTER — Emergency Department: Payer: Medicaid Other

## 2019-05-14 LAB — BASIC METABOLIC PANEL
Anion gap: 9 (ref 5–15)
BUN: 17 mg/dL (ref 6–20)
CO2: 20 mmol/L — ABNORMAL LOW (ref 22–32)
Calcium: 8 mg/dL — ABNORMAL LOW (ref 8.9–10.3)
Chloride: 112 mmol/L — ABNORMAL HIGH (ref 98–111)
Creatinine, Ser: 0.75 mg/dL (ref 0.44–1.00)
GFR calc Af Amer: >60 mL/min (ref >60)
GFR calc non Af Amer: >60 mL/min (ref >60)
Glucose, Bld: 102 mg/dL — ABNORMAL HIGH (ref 70–99)
Potassium: 3.5 mmol/L (ref 3.5–5.1)
Sodium: 141 mmol/L (ref 135–145)

## 2019-05-14 LAB — CBC WITH DIFFERENTIAL/PLATELET
Abs Immature Granulocytes: 1.11 10*3/uL — ABNORMAL HIGH (ref 0.00–0.07)
Basophils Absolute: 0.1 10*3/uL (ref 0.0–0.1)
Basophils Relative: 1 %
Eosinophils Absolute: 0.4 10*3/uL (ref 0.0–0.5)
Eosinophils Relative: 3 %
HCT: 24 % — ABNORMAL LOW (ref 36.0–46.0)
Hemoglobin: 7.7 g/dL — ABNORMAL LOW (ref 12.0–15.0)
Immature Granulocytes: 8 %
Lymphocytes Relative: 31 %
Lymphs Abs: 4.2 10*3/uL — ABNORMAL HIGH (ref 0.7–4.0)
MCH: 32.2 pg (ref 26.0–34.0)
MCHC: 32.1 g/dL (ref 30.0–36.0)
MCV: 100.4 fL — ABNORMAL HIGH (ref 80.0–100.0)
Monocytes Absolute: 0.7 10*3/uL (ref 0.1–1.0)
Monocytes Relative: 5 %
Neutro Abs: 7.3 10*3/uL (ref 1.7–7.7)
Neutrophils Relative %: 52 %
Platelets: 395 10*3/uL (ref 150–400)
RBC: 2.39 MIL/uL — ABNORMAL LOW (ref 3.87–5.11)
RDW: 16.7 % — ABNORMAL HIGH (ref 11.5–15.5)
WBC: 13.7 10*3/uL — ABNORMAL HIGH (ref 4.0–10.5)
nRBC: 0.6 % — ABNORMAL HIGH (ref 0.0–0.2)

## 2019-05-14 LAB — FIBRIN DERIVATIVES D-DIMER (ARMC ONLY): Fibrin derivatives D-dimer (ARMC): 4682.52 ng/mL (FEU) — ABNORMAL HIGH (ref 0.00–499.00)

## 2019-05-14 MED ORDER — SODIUM CHLORIDE 0.9 % IV BOLUS
1000.0000 mL | Freq: Once | INTRAVENOUS | Status: AC
  Administered 2019-05-14: 01:00:00 1000 mL via INTRAVENOUS

## 2019-05-14 NOTE — ED Provider Notes (Signed)
[facility] Emergency Department Provider Note  ____________________________________________   First MD Initiated Contact with Patient 05/13/19 2349     (approximate)  I have reviewed the triage vital signs and the nursing notes.   HISTORY  Chief Complaint Leg Swelling    HPI Elizabeth Frey is a 28 y.o. female with medical and surgical history as listed below who presents for evaluation of swelling in both legs.  She says that she flew to [place] about 11 days ago for bilateral thigh liposuction and then flew back.  She was told to expect some swelling for a couple of weeks but she is concerned because it seems to be getting worse instead of better.  It seems to involve her thighs, which she expected, but it is now extending down past her knees and sometimes down to her feet.  It is on both sides, not 1 or the other.  There is no pain except for at the surgical sites and that has not changed or gotten any worse.  She has had some consistent bruising but no redness or spreading streaks of red extending up or down her legs.  She denies fever/chills, sore throat, loss of smell and taste, chest pain, cough, shortness of breath, nausea, vomiting, abdominal pain, and dysuria.  She uses a Depo shot for birth control and has no history of blood clots in the legs of the lungs.  She describes the symptoms as moderate in severity.  The swelling gets better when she wears the recommended compression stockings although the swelling happens in spite of the stockings, better with elevation, worse with ambulation and when she is upright.   the patient also made a comment that she just wonders if she could have eaten too much salt for Thanksgiving and that made the swelling worse.        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    Patient Active Problem List   Diagnosis Date Noted    Biliary colic 07/29/2018   Vitamin D deficiency 11/08/2017   History of bilateral breast reduction surgery 07/15/2017    Past Surgical History:  Procedure Laterality Date   BREAST REDUCTION SURGERY  08/24/2011   Procedure: MAMMARY REDUCTION BILATERAL (BREAST);  Surgeon: Karie Fetch, MD;  Location: [facility];  Service: Plastics;  Laterality: Bilateral;   BREAST SURGERY     CESAREAN SECTION     CHOLECYSTECTOMY N/A 07/30/2018   Procedure: LAPAROSCOPIC CHOLECYSTECTOMY;  Surgeon: Sung Amabile, DO;  Location: [facility];  Service: General;  Laterality: N/A;   DILATION AND CURETTAGE OF UTERUS  2008   LIPOSUCTION EXTREMITIES     WISDOM TOOTH EXTRACTION      Prior to Admission medications   Medication Sig Start Date End Date Taking? Authorizing Provider  ibuprofen (ADVIL,MOTRIN) 800 MG tablet Take 1 tablet (800 mg total) by mouth every 8 (eight) hours as needed for mild pain or moderate pain. 07/31/18   Sung Amabile, DO    Allergies Patient has no known allergies.  No family history on file.  Social History Social History   Tobacco Use   Smoking status: Former Smoker   Smokeless tobacco: Never Used  Substance Use Topics   Alcohol use: No   Drug use: No    Review of Systems Constitutional: No fever/chills Eyes: No visual changes. ENT: No sore throat. Cardiovascular: Denies chest pain. Respiratory:  Denies shortness of breath. Gastrointestinal: No abdominal pain.  No nausea, no vomiting.  No diarrhea.  No constipation. Genitourinary: Negative for dysuria. Musculoskeletal: Swelling in bilateral lower extremities as described above.  Negative for neck pain.  Negative for back pain. Integumentary: Negative for rash. Neurological: Negative for headaches, focal weakness or numbness.   ____________________________________________   PHYSICAL EXAM:  VITAL SIGNS: ED Triage Vitals  Enc Vitals Group     BP 05/13/19 2204 137/72     Pulse Rate  05/13/19 2204 (!) 121     Resp 05/13/19 2204 18     Temp 05/13/19 2204 98.9 F (37.2 C)     Temp Source 05/13/19 2204 Oral     SpO2 05/13/19 2204 100 %     Weight 05/13/19 2205 68 kg (150 lb)     Height 05/13/19 2205 1.6 m (5\' 3" )     Head Circumference --      Peak Flow --      Pain Score 05/13/19 2204 6     Pain Loc --      Pain Edu? --      Excl. in [place]? --     Constitutional: Alert and oriented.  Well-appearing, no acute distress. Eyes: Conjunctivae are normal.  Head: Atraumatic. Nose: No congestion/rhinnorhea. Mouth/Throat: Patient is wearing a mask. Neck: No stridor.  No meningeal signs.   Cardiovascular: Normal rate, regular rhythm. Good peripheral circulation. Grossly normal heart sounds.  Dorsalis pedis pulses are easily palpable in both feet. Respiratory: Normal respiratory effort.  No retractions. Gastrointestinal: Soft and nontender. No distention.  Musculoskeletal: There is some trace pitting edema in both lower extremities starting at the thighs and going down just below the knee.  Compartments are soft and easily compressible and nontender.  Surgical sites are well-appearing with no sign of infection.  There is no crepitus and there is no pain out of proportion to the exam (in fact there is no tenderness at all). Neurologic:  Normal speech and language. No gross focal neurologic deficits are appreciated.  Skin:  Skin is warm, dry and intact.  The patient has some subacute bruising on both legs but no sign of cellulitis. Psychiatric: Mood and affect are normal. Speech and behavior are normal.  ____________________________________________   LABS (all labs ordered are listed, but only abnormal results are displayed)  Labs Reviewed  CBC WITH DIFFERENTIAL/PLATELET - Abnormal; Notable for the following components:      Result Value   WBC 13.7 (*)    RBC 2.39 (*)    Hemoglobin 7.7 (*)    HCT 24.0 (*)    MCV 100.4 (*)    RDW 16.7 (*)    nRBC 0.6 (*)    Lymphs Abs  4.2 (*)    Abs Immature Granulocytes 1.11 (*)    All other components within normal limits  BASIC METABOLIC PANEL - Abnormal; Notable for the following components:   Chloride 112 (*)    CO2 20 (*)    Glucose, Bld 102 (*)    Calcium 8.0 (*)    All other components within normal limits  FIBRIN DERIVATIVES D-DIMER ([facility] ONLY) - Abnormal; Notable for the following components:   Fibrin derivatives D-dimer [facility]) 2,376.28 (*)    All other components within normal limits   ____________________________________________  EKG  No indication for EKG ____________________________________________  RADIOLOGY I, Hinda Kehr, personally viewed and evaluated these images (plain radiographs) as part of my medical decision making, as well as reviewing the written report  by the radiologist.  ED MD interpretation:  No evidence of DVT  Official radiology report(s): [place] Venous Img Lower Bilateral  Result Date: 05/14/2019 CLINICAL DATA:  Tachycardia and leg swelling, recent leg liposuction EXAM: BILATERAL LOWER EXTREMITY VENOUS DOPPLER ULTRASOUND TECHNIQUE: Gray-scale sonography with graded compression, as well as color Doppler and duplex ultrasound were performed to evaluate the lower extremity deep venous systems from the level of the common femoral vein and including the common femoral, femoral, profunda femoral, popliteal and calf veins including the posterior tibial, peroneal and gastrocnemius veins when visible. The superficial great saphenous vein was also interrogated. Spectral Doppler was utilized to evaluate flow at rest and with distal augmentation maneuvers in the common femoral, femoral and popliteal veins. COMPARISON:  None. FINDINGS: RIGHT LOWER EXTREMITY Common Femoral Vein: No evidence of thrombus. Normal compressibility, respiratory phasicity and response to augmentation. Saphenofemoral Junction: No evidence of thrombus. Normal compressibility and flow on color Doppler imaging. Profunda Femoral  Vein: No evidence of thrombus. Normal compressibility and flow on color Doppler imaging. Femoral Vein: No evidence of thrombus. Normal compressibility, respiratory phasicity and response to augmentation. Popliteal Vein: No evidence of thrombus. Normal compressibility, respiratory phasicity and response to augmentation. Calf Veins: No evidence of thrombus. Normal compressibility and flow on color Doppler imaging. Superficial Great Saphenous Vein: No evidence of thrombus. Normal compressibility. Venous Reflux:  None. Other Findings: Within the right medial upper thigh there is a anechoic cystic fluid collection noted. LEFT LOWER EXTREMITY Common Femoral Vein: No evidence of thrombus. Normal compressibility, respiratory phasicity and response to augmentation. Saphenofemoral Junction: No evidence of thrombus. Normal compressibility and flow on color Doppler imaging. Profunda Femoral Vein: No evidence of thrombus. Normal compressibility and flow on color Doppler imaging. Femoral Vein: No evidence of thrombus. Normal compressibility, respiratory phasicity and response to augmentation. Popliteal Vein: No evidence of thrombus. Normal compressibility, respiratory phasicity and response to augmentation. Calf Veins: No evidence of thrombus. Normal compressibility and flow on color Doppler imaging. Superficial Great Saphenous Vein: No evidence of thrombus. Normal compressibility. Venous Reflux:  None. Other Findings:  None. IMPRESSION: No evidence of deep venous thrombosis in either lower extremity. Small fluid collection within the right medial upper thigh. This could be due to postsurgical changes/seroma Electronically Signed   By: Jonna ClarkBindu  Avutu M.D.   On: 05/14/2019 02:48    ____________________________________________   PROCEDURES   Procedure(s) performed (including Critical Care):  Procedures   ____________________________________________   INITIAL IMPRESSION / MDM / ASSESSMENT AND PLAN / ED COURSE  As  part of my medical decision making, I reviewed the following data within the electronic MEDICAL RECORD NUMBER Nursing notes reviewed and incorporated, Labs reviewed , Old chart reviewed and Notes from prior ED visits   Differential diagnosis includes, but is not limited to, normal postoperative complication of liposuction, DVT, cellulitis, necrotizing fasciitis, compartment syndrome.  The patient is well-appearing in no acute distress.  She has no symptoms except for some swelling which is relatively minor and mild that she has a reassuring physical exam.  However she was tachycardic at about 120 when she came to the ED and this could be due to anxiety or exertion but even at rest she has a heart rate of greater than 100.  I think that DVT is unlikely but she has recently had a surgical procedure with relatively limited mobility and has been traveling to [place].  I will check some basic blood work including a D-dimer.  If her D-dimer is normal I  do not think she requires ultrasounds given that there is a reasonable alternative diagnosis for the swelling and even the tachycardia, but if it is elevated then I will get bilateral lower extremity Doppler ultrasounds.  Even though I have a low suspicion of an emergent medical condition, and there is no sign of an infectious process at this time, her tachycardia and history is such that I think it needs to be further evaluated.  The patient understands and agrees with the plan.       Clinical Course as of May 13 344  Mon May 14, 2019  0134 Substantially elevated d-dimer.  May just be from surgery, but will evalute with bilateral lower extremity ultrasounds  Fibrin derivatives D-dimer [facility])(!): [phone] [CF]  0140 Suspect secondary to recent surgeries, not representative of infection  WBC(!): 13.7 [CF]  0341 I gave the patient 500 mL normal saline bolus.  At rest her heart rate was down below 100 and then after I came in and started talking to her he  came up to 100 again.  I told her the reassuring results of the ultrasound with no sign of DVT.  I asked her again and had her reiterate that she has had absolutely no chest pain, shortness of breath, or other signs or symptoms consistent with pulmonary embolism.  She has been 100% SPO2 the whole time she has been in the emergency department.  I believe that the D-dimer is elevated because of her surgical procedures and not because of pulmonary emboli given that she is asymptomatic except for what appears to be normal postoperative leg swelling.  I explained to her that I was not going to order a CT scan of her chest at this time and [place], and she understands.  She can follow-up with [facility] locally and I gave my usual and customary return precautions.  She understands and agrees with the plan.   [CF]    Clinical Course User Index [CF] Loleta RoseForbach, Albert Devaul, MD     ____________________________________________  FINAL CLINICAL IMPRESSION(S) / ED DIAGNOSES  Final diagnoses:  Peripheral edema  Elevated d-dimer     MEDICATIONS GIVEN DURING THIS VISIT:  Medications  sodium chloride 0.9 % bolus 1,000 mL (0 mLs Intravenous Stopped 05/14/19 0215)     ED Discharge Orders    None      *Please note:  Cherie DarkShaquanna L Frey was evaluated in Emergency Department on 05/14/2019 for the symptoms described in the history of present illness. She was evaluated in the context of the global COVID-19 pandemic, which necessitated consideration that the patient might be at risk for infection with the SARS-CoV-2 virus that causes COVID-19. Institutional protocols and algorithms that pertain to the evaluation of patients at risk for COVID-19 are in a state of rapid change based on information released by regulatory bodies including the CDC and federal and state organizations. These policies and algorithms were followed during the patient's care in the ED.  Some ED evaluations and interventions may be delayed  as a result of limited staffing during the pandemic.*  Note:  This document was prepared using Dragon voice recognition software and may include unintentional dictation errors.   Loleta RoseForbach, Caine Barfield, MD 05/14/19 [phone]

## 2019-05-14 NOTE — ED Notes (Signed)
ED Provider at bedside. 

## 2019-05-14 NOTE — Discharge Instructions (Signed)
As we discussed, we obtained ultrasounds of your legs and there is no sign of blood clots in the legs.  Although your D-dimer is elevated, I believe this is because of your recent procedures and not representative of blood clots in your lungs.  You have no chest pain nor shortness of breath and your oxygen level has been 100% the entire time in the emergency department.  I recommend that you follow-up with your primary care doctor at the next available opportunity for reassessment.  If you develop any new or worsening symptoms, including chest pain, shortness of breath, passing out, or other symptoms that concern you, please return immediately to the emergency department.

## 2019-09-29 IMAGING — US US ABDOMEN LIMITED
1 series · 14 of 25 positions shown · non-contrast
Comparison: April 20, 2017

CLINICAL DATA: Three-week history of epigastric pain

EXAM:
ULTRASOUND ABDOMEN LIMITED RIGHT UPPER QUADRANT

[Series 1: us abdomen limited · 14 of 45 slices shown]
[im 1/45]
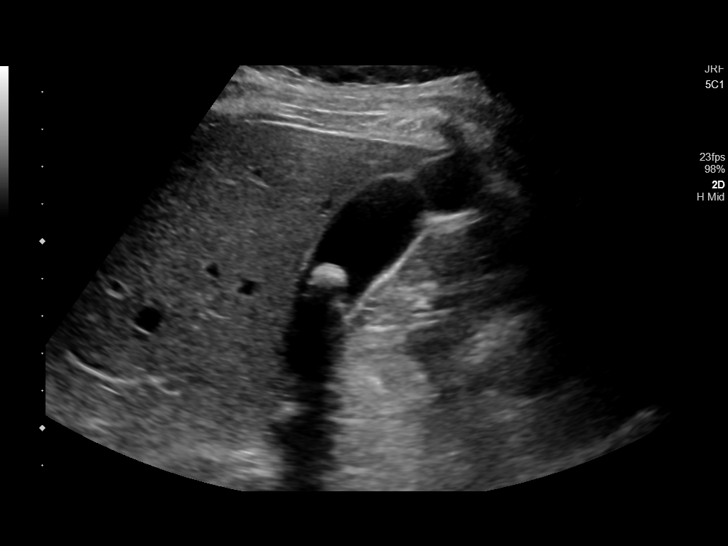
[im 4/45]
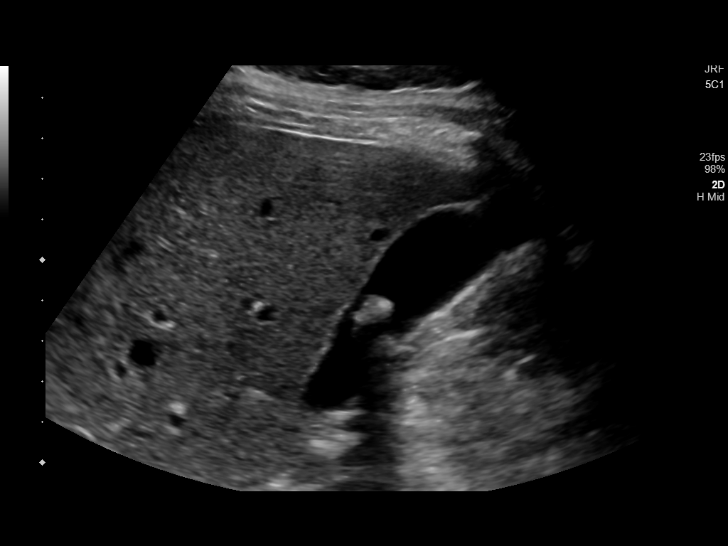
[im 8/45]
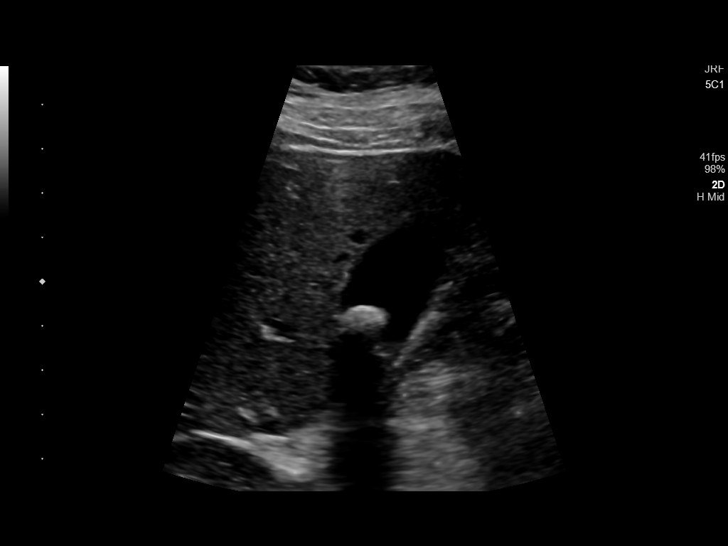
[im 12/45]
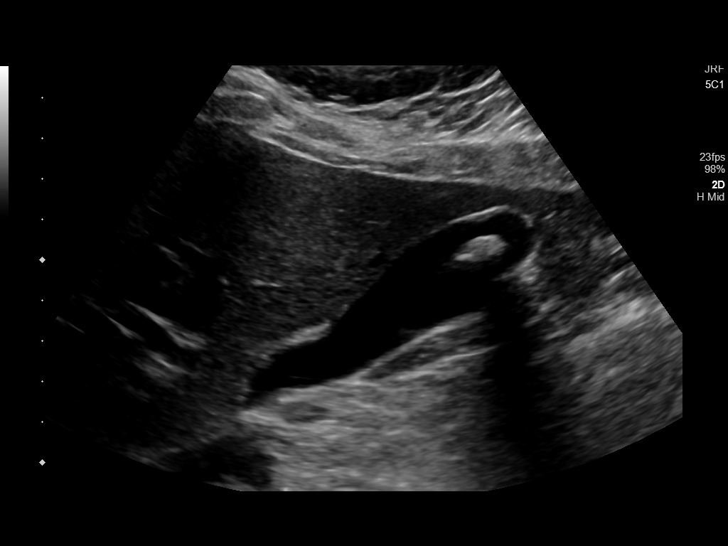
[im 15/45]
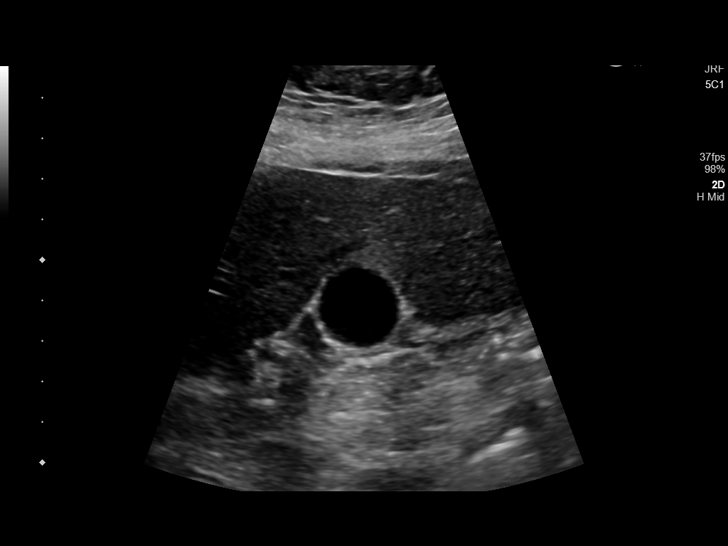
[im 17/45]
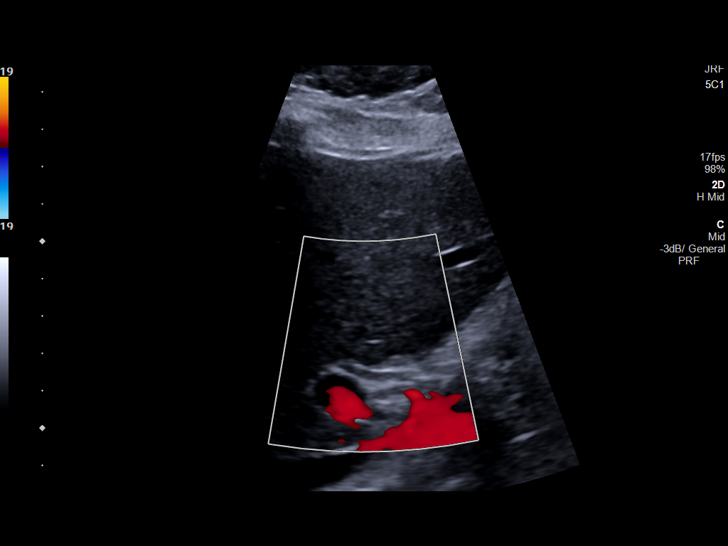
[im 21/45]
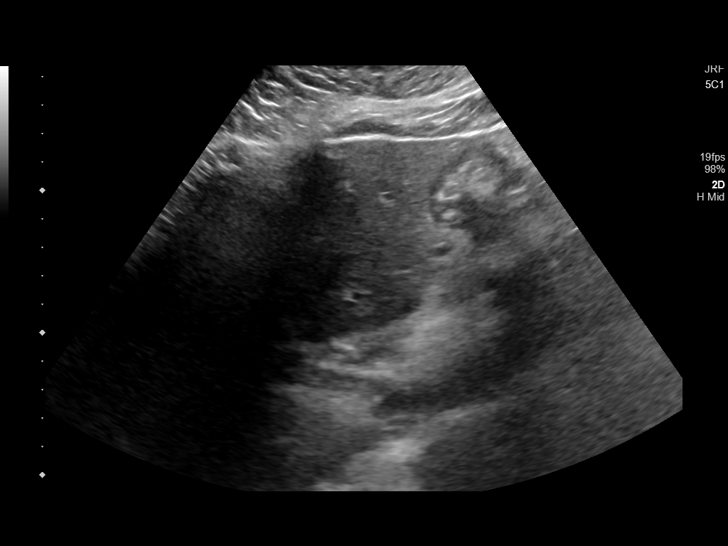
[im 24/45]
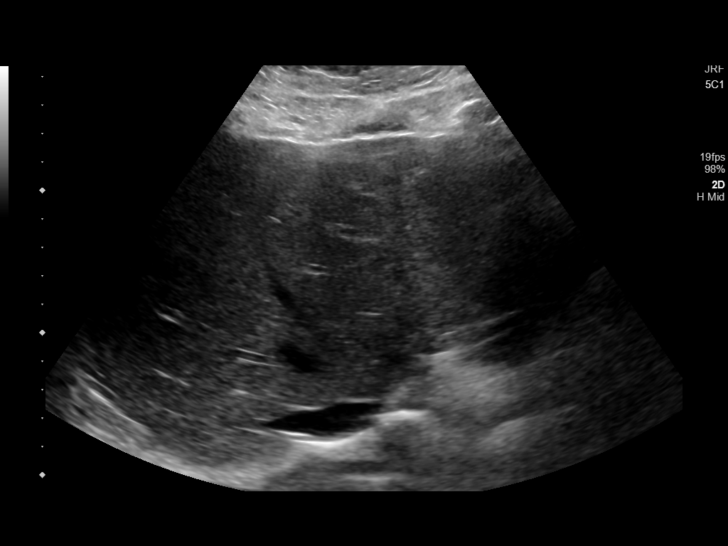
[im 28/45]
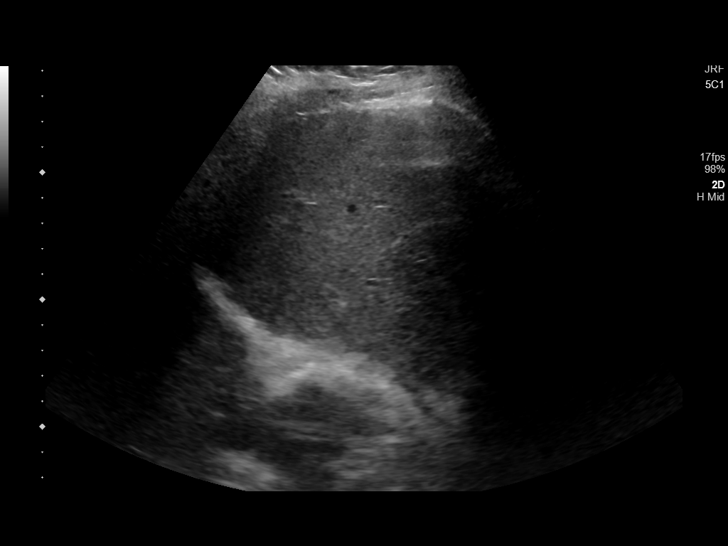
[im 30/45]
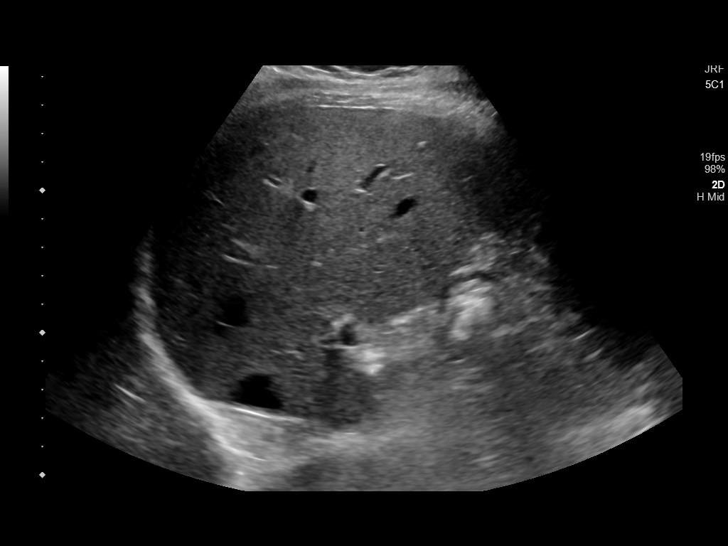
[im 34/45]
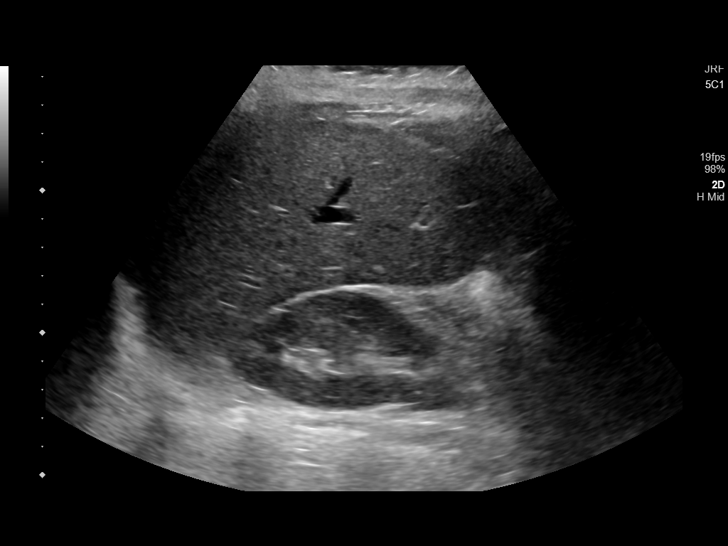
[im 37/45]
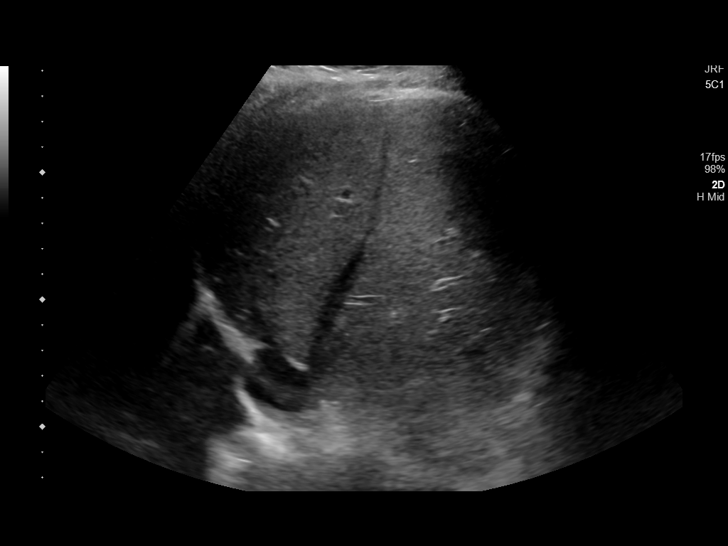
[im 41/45]
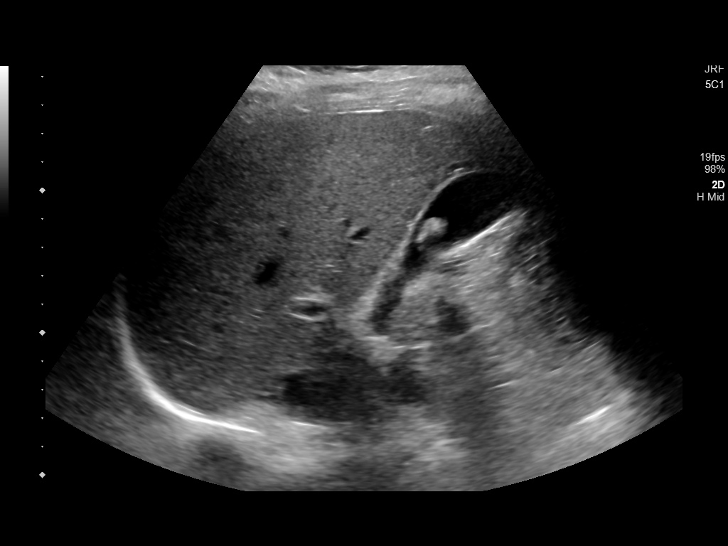
[im 45/45]
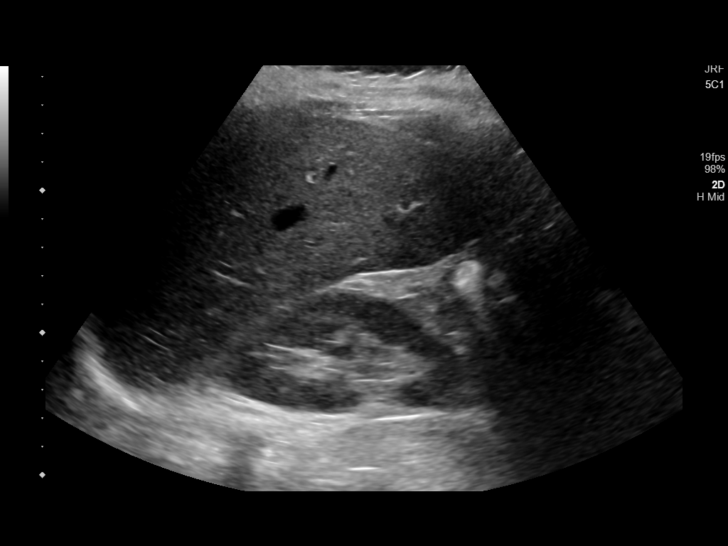

[14 of 25 positions shown; findings below may reference images not displayed]

FINDINGS: Gallbladder:

Within the gallbladder, there are echogenic foci which move and
shadow consistent with cholelithiasis. Largest gallstone measures
1.3 cm in length. There is no gallbladder wall thickening or
pericholecystic fluid. No sonographic Murphy sign noted by
sonographer.

Common bile duct:

Diameter: 2 mm. No intrahepatic or extrahepatic biliary duct
dilatation.

Liver:

No focal lesion identified. Within normal limits in parenchymal
echogenicity. Portal vein is patent on color Doppler imaging with
normal direction of blood flow towards the liver.
IMPRESSION: Cholelithiasis. No gallbladder wall thickening or pericholecystic
fluid. Study otherwise unremarkable.

## 2020-06-11 ENCOUNTER — Other Ambulatory Visit: Payer: Medicaid Other

## 2020-08-11 IMAGING — US US EXTREM LOW VENOUS
1 series · 13 of 24 positions shown · non-contrast
Comparison: None.

CLINICAL DATA: Tachycardia and leg swelling, recent leg liposuction



[Series 1: us extrem low venous · 13 of 77 slices shown]
[im 1/77]
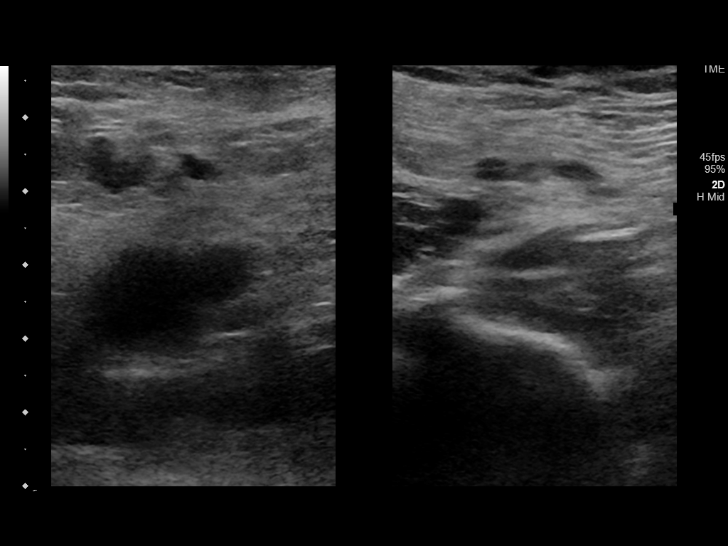
[im 7/77]
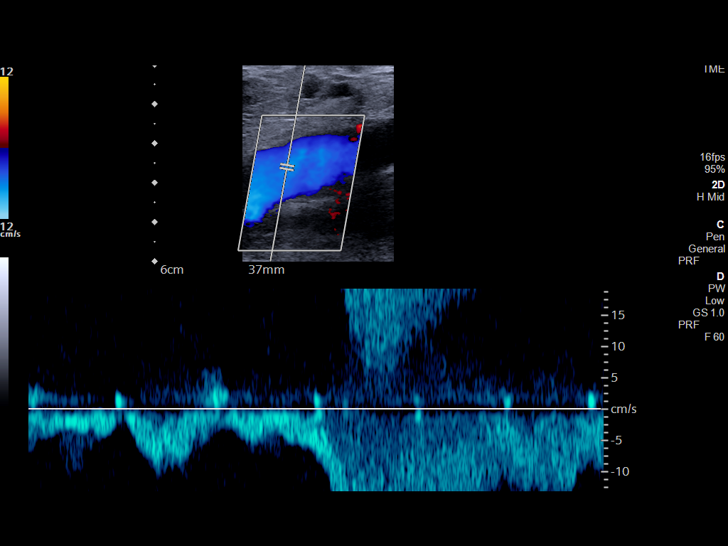
[im 14/77]
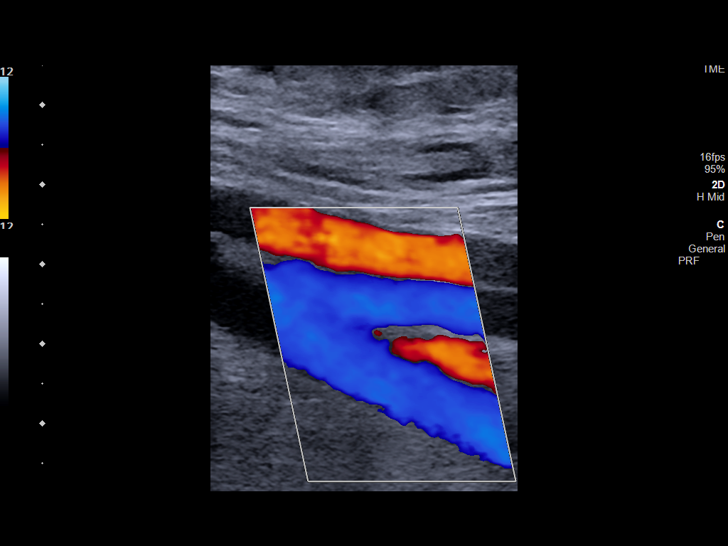
[im 20/77]
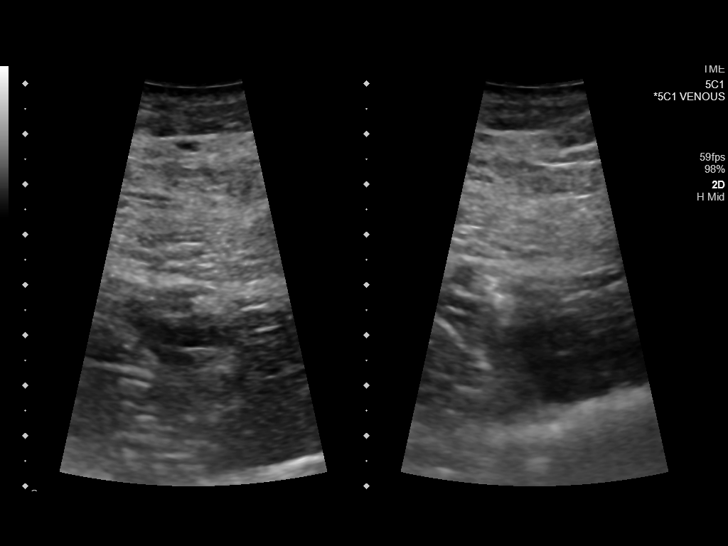
[im 27/77]
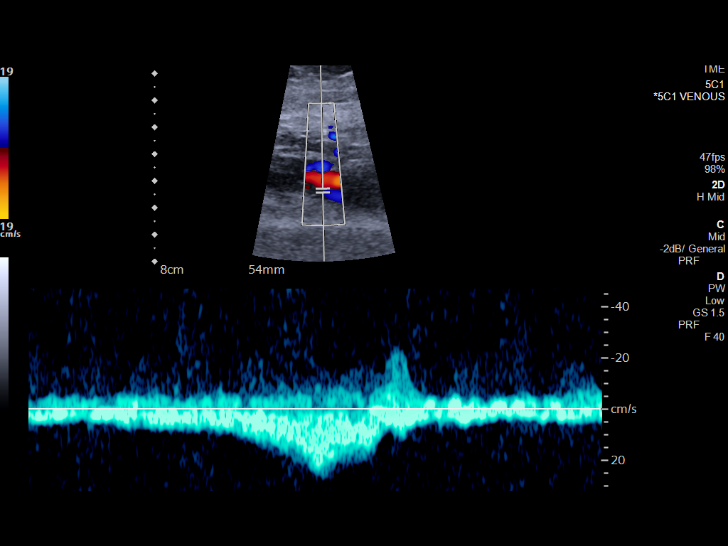
[im 34/77]
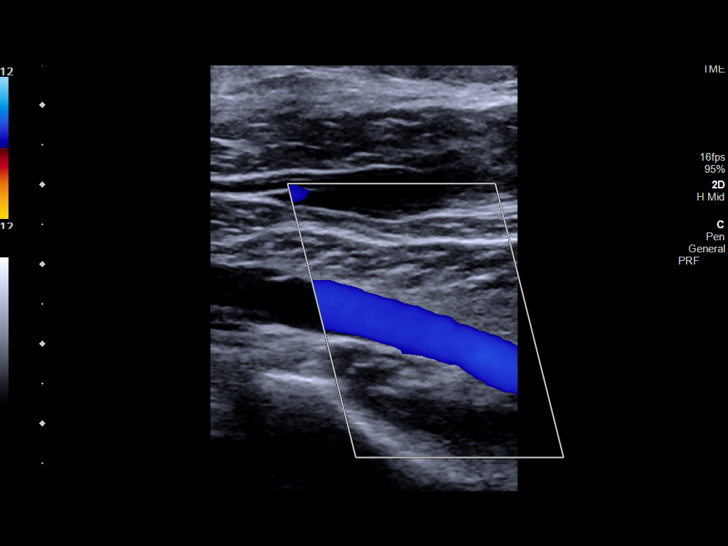
[im 40/77]
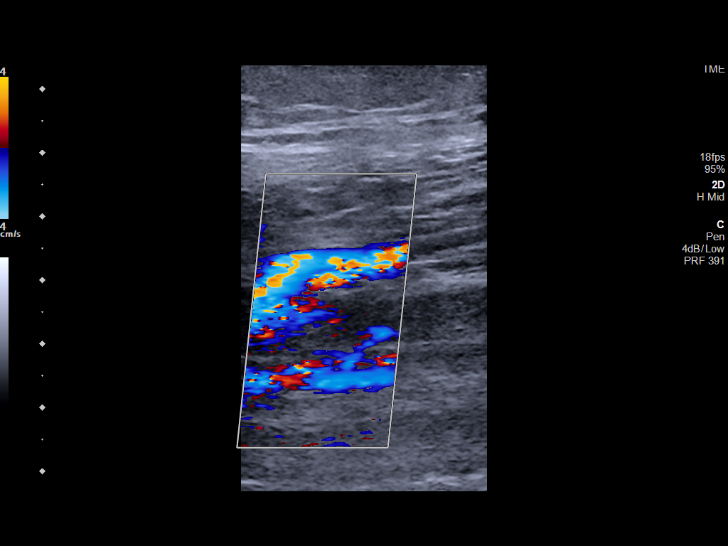
[im 43/77]
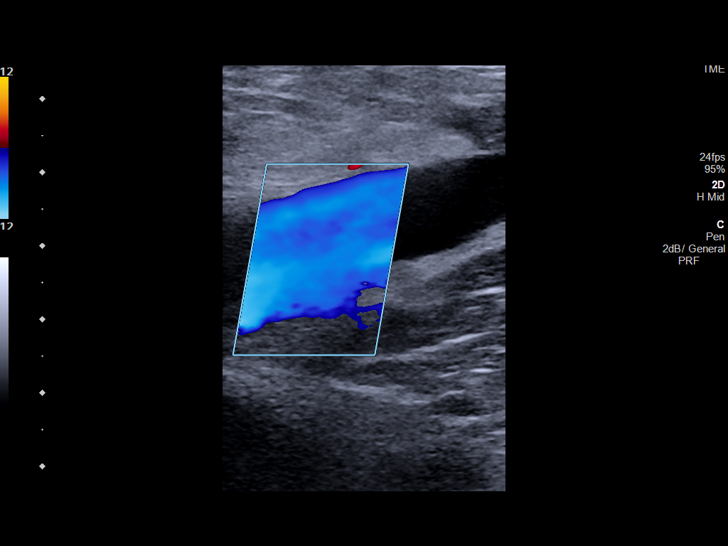
[im 50/77]
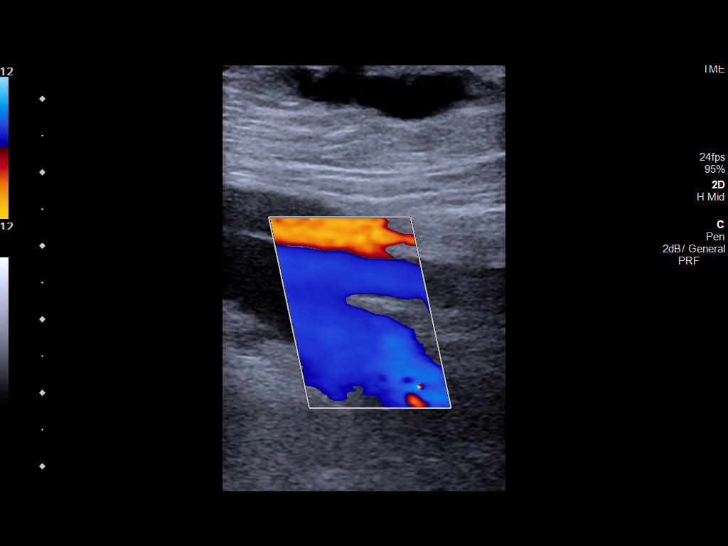
[im 57/77]
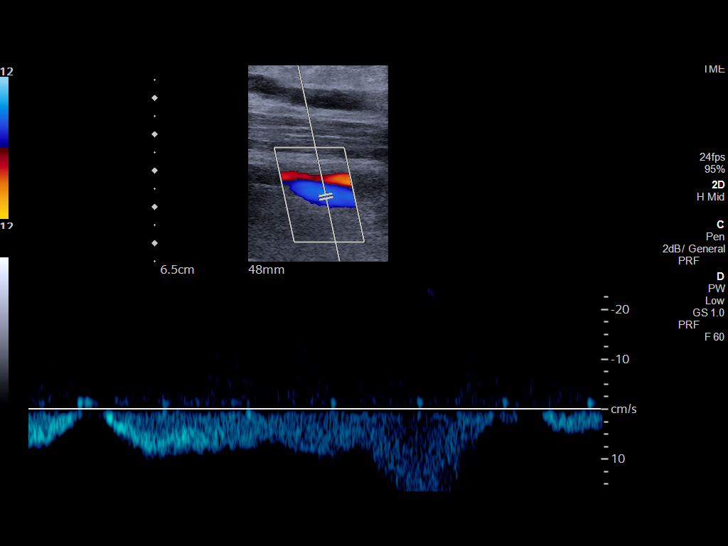
[im 63/77]
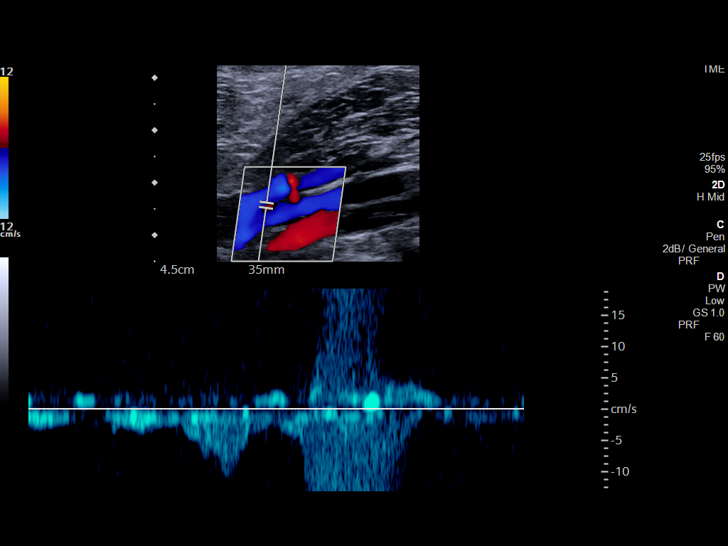
[im 70/77]
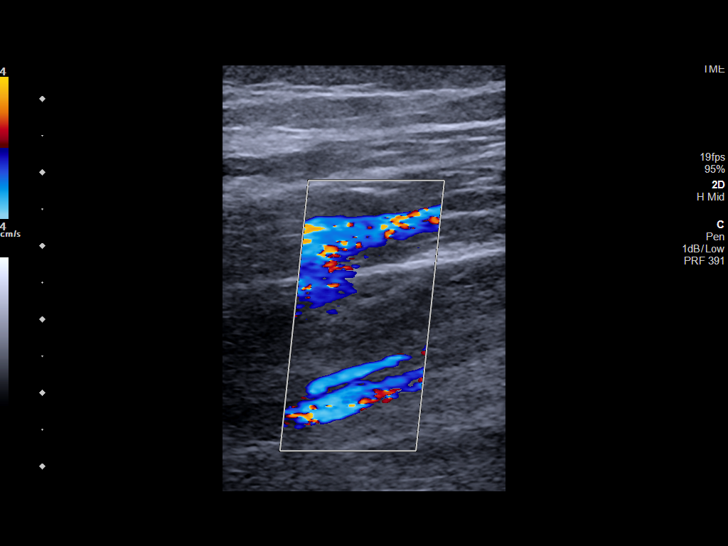
[im 77/77]
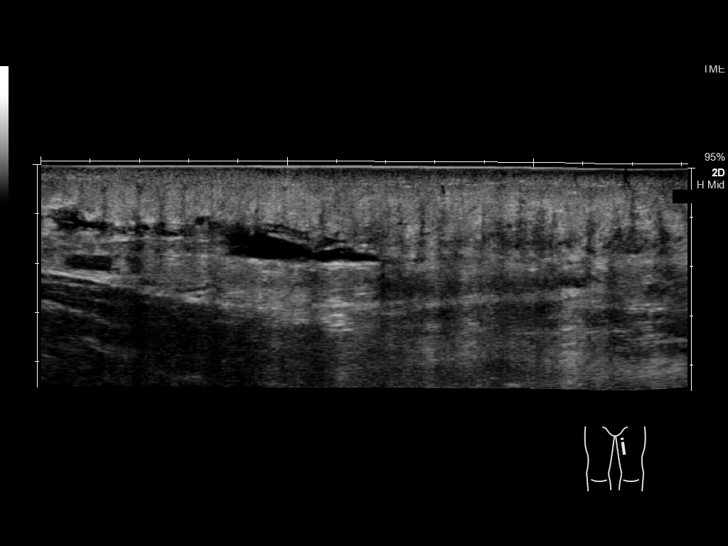

[13 of 24 positions shown; findings below may reference images not displayed]

FINDINGS: RIGHT LOWER EXTREMITY

Common Femoral Vein: No evidence of thrombus. Normal
compressibility, respiratory phasicity and response to augmentation.

Saphenofemoral Junction: No evidence of thrombus. Normal
compressibility and flow on color Doppler imaging.

Profunda Femoral Vein: No evidence of thrombus. Normal
compressibility and flow on color Doppler imaging.

Femoral Vein: No evidence of thrombus. Normal compressibility,
respiratory phasicity and response to augmentation.

Popliteal Vein: No evidence of thrombus. Normal compressibility,
respiratory phasicity and response to augmentation.

Calf Veins: No evidence of thrombus. Normal compressibility and flow
on color Doppler imaging.

Superficial Great Saphenous Vein: No evidence of thrombus. Normal
compressibility.

Venous Reflux:  None.

Other Findings: Within the right medial upper thigh there is a
anechoic cystic fluid collection noted.

LEFT LOWER EXTREMITY

Common Femoral Vein: No evidence of thrombus. Normal
compressibility, respiratory phasicity and response to augmentation.

Saphenofemoral Junction: No evidence of thrombus. Normal
compressibility and flow on color Doppler imaging.

Profunda Femoral Vein: No evidence of thrombus. Normal
compressibility and flow on color Doppler imaging.

Femoral Vein: No evidence of thrombus. Normal compressibility,
respiratory phasicity and response to augmentation.

Popliteal Vein: No evidence of thrombus. Normal compressibility,
respiratory phasicity and response to augmentation.

Calf Veins: No evidence of thrombus. Normal compressibility and flow
on color Doppler imaging.

Superficial Great Saphenous Vein: No evidence of thrombus. Normal
compressibility.

Venous Reflux:  None.

Other Findings:  None.
IMPRESSION: No evidence of deep venous thrombosis in either lower extremity.

Small fluid collection within the right medial upper thigh. This
could be due to postsurgical changes/seroma

## 2022-04-22 ENCOUNTER — Other Ambulatory Visit: Payer: Self-pay | Admitting: Physician Assistant

## 2022-04-22 DIAGNOSIS — R519 Headache, unspecified: Secondary | ICD-10-CM

## 2022-04-22 DIAGNOSIS — R5383 Other fatigue: Secondary | ICD-10-CM

## 2022-04-22 DIAGNOSIS — G35 Multiple sclerosis: Secondary | ICD-10-CM

## 2022-04-29 ENCOUNTER — Ambulatory Visit: Payer: Medicaid Other

## 2022-04-29 ENCOUNTER — Ambulatory Visit
Admission: RE | Admit: 2022-04-29 | Discharge: 2022-04-29 | Disposition: A | Discharge status: Home / Self Care | Payer: Medicaid Other | Source: Ambulatory Visit | Attending: Physician Assistant | Admitting: Physician Assistant

## 2022-04-29 DIAGNOSIS — R5383 Other fatigue: Secondary | ICD-10-CM | POA: Insufficient documentation

## 2022-04-29 DIAGNOSIS — R519 Headache, unspecified: Secondary | ICD-10-CM | POA: Insufficient documentation

## 2022-04-29 DIAGNOSIS — G35 Multiple sclerosis: Secondary | ICD-10-CM | POA: Insufficient documentation

## 2022-04-29 MED ORDER — GADOBUTROL 1 MMOL/ML IV SOLN
6.0000 mL | Freq: Once | INTRAVENOUS | Status: AC | PRN
  Administered 2022-04-29: 6 mL via INTRAVENOUS

## 2022-07-23 ENCOUNTER — Telehealth: Payer: Self-pay

## 2022-07-23 NOTE — Telephone Encounter (Signed)
Pt called & [facility] called and left vm regarding what status of patient's leave paperwork is? Please advise

## 2022-07-28 ENCOUNTER — Ambulatory Visit (INDEPENDENT_AMBULATORY_CARE_PROVIDER_SITE_OTHER): Payer: Medicaid Other | Admitting: Family

## 2022-07-28 ENCOUNTER — Encounter: Payer: Self-pay | Admitting: Family

## 2022-07-28 VITALS — BP 122/80 | HR 75 | Ht 63.0 in | Wt 159.4 lb

## 2022-07-28 DIAGNOSIS — J101 Influenza due to other identified influenza virus with other respiratory manifestations: Secondary | ICD-10-CM

## 2022-07-28 DIAGNOSIS — G9332 Myalgic encephalomyelitis/chronic fatigue syndrome: Secondary | ICD-10-CM | POA: Diagnosis not present

## 2022-07-28 DIAGNOSIS — Z6828 Body mass index (BMI) 28.0-28.9, adult: Secondary | ICD-10-CM

## 2022-07-28 DIAGNOSIS — U099 Post covid-19 condition, unspecified: Secondary | ICD-10-CM

## 2022-07-28 DIAGNOSIS — G35 Multiple sclerosis: Secondary | ICD-10-CM | POA: Diagnosis not present

## 2022-07-28 DIAGNOSIS — G35D Multiple sclerosis, unspecified: Secondary | ICD-10-CM

## 2022-07-28 NOTE — Progress Notes (Signed)
Established Patient Office Visit  Subjective:  Patient ID: Elizabeth Frey, female    DOB: December 13, 1990  Age: 32 y.o. MRN: [id number]  Chief Complaint  Patient presents with   Follow-up    Follow up    The patient is here today for recent follow-up after her COVID and flu diagnoses in January.  She says that she is feeling somewhat better but is certainly still not at 100%.  I would expect that she would take longer to recover given her history and her MS.  Needs her paperwork completed for work.  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    Social History   Socioeconomic History   Marital status: Single    Spouse name: Not on file   Number of children: Not on file   Years of education: Not on file   Highest education level: Not on file  Occupational History   Not on file  Tobacco Use   Smoking status: Former   Smokeless tobacco: Never  Vaping Use   Vaping Use: Never used  Substance and Sexual Activity   Alcohol use: No   Drug use: No   Sexual activity: Yes    Birth control/protection: Other-see comments, Injection  Other Topics Concern   Not on file  Social History Narrative   Not on file   Social Determinants of Health   Financial Resource Strain: Not on file  Food Insecurity: Not on file  Transportation Needs: Not on file  Physical Activity: Not on file  Stress: Not on file  Social Connections: Not on file  Intimate Partner Violence: Not on file    History reviewed. No pertinent family history.  No Known Allergies  Review of Systems  Constitutional:  Positive for malaise/fatigue.  Respiratory:  Positive for cough and shortness of breath. Negative for sputum production.   All other systems reviewed and are negative.      Objective:   BP 122/80   Pulse 75    Ht 5' 3"$  (1.6 m)   Wt 159 lb 6.4 oz (72.3 kg)   SpO2 98%   BMI 28.24 kg/m   Vitals:   07/28/22 1301  BP: 122/80  Pulse: 75  Height: 5' 3"$  (1.6 m)  Weight: 159 lb 6.4 oz (72.3 kg)  SpO2: 98%  BMI (Calculated): 28.24    Physical Exam Vitals and nursing note reviewed.  Constitutional:      General: She is not in acute distress.    Appearance: Normal appearance. She is ill-appearing.  Cardiovascular:     Rate and Rhythm: Normal rate and regular rhythm.     Pulses: Normal pulses.     Heart sounds: Normal heart sounds.  Pulmonary:     Effort: Pulmonary effort is normal.     Breath sounds: Normal breath sounds.  Neurological:     Mental Status: She is alert.      No results found for any visits on 07/28/22.  No results found for this or any previous visit (from the past 2160 hour(s)).    Assessment & Plan:   Problem List Items Addressed This Visit     MS (multiple sclerosis) ([place]) - Primary   COVID-19 long hauler manifesting chronic fatigue    Given continued fatigue and shortness of breath with  activity, paperwork has been completed.  We will follow up in 6 weeks to reevaluate so we can see if this needs to be extended.       Body mass index 28.0-28.9, adult   Other Visit Diagnoses     Influenza A           Return in about 6 weeks (around 09/08/2022).   Total time spent: 20 minutes  Mechele Claude, FNP  07/28/2022

## 2022-07-28 NOTE — Assessment & Plan Note (Signed)
Given continued fatigue and shortness of breath with activity, paperwork has been completed.  We will follow up in 6 weeks to reevaluate so we can see if this needs to be extended.

## 2022-09-09 ENCOUNTER — Encounter: Payer: Self-pay | Admitting: Family

## 2022-09-09 ENCOUNTER — Ambulatory Visit: Payer: Medicaid Other | Admitting: Family

## 2022-09-09 ENCOUNTER — Other Ambulatory Visit: Payer: Self-pay

## 2022-09-09 VITALS — BP 128/60 | HR 68 | Ht 63.0 in | Wt 161.0 lb

## 2022-09-09 DIAGNOSIS — U099 Post covid-19 condition, unspecified: Secondary | ICD-10-CM | POA: Diagnosis not present

## 2022-09-09 DIAGNOSIS — G9332 Myalgic encephalomyelitis/chronic fatigue syndrome: Secondary | ICD-10-CM

## 2022-09-09 DIAGNOSIS — Z309 Encounter for contraceptive management, unspecified: Secondary | ICD-10-CM | POA: Diagnosis not present

## 2022-09-09 DIAGNOSIS — Z1389 Encounter for screening for other disorder: Secondary | ICD-10-CM

## 2022-09-09 LAB — POCT XPERT XPRESS SARS COVID-2/FLU/RSV
FLU A: NEGATIVE
FLU B: NEGATIVE
RSV RNA, PCR: NEGATIVE
SARS Coronavirus 2: NEGATIVE

## 2022-09-09 LAB — POCT URINE PREGNANCY: Preg Test, Ur: NEGATIVE

## 2022-09-09 MED ORDER — MEDROXYPROGESTERONE ACETATE 150 MG/ML IM SUSY: 150.0000 mg | PREFILLED_SYRINGE | INTRAMUSCULAR | Status: AC

## 2022-09-09 MED ORDER — AZITHROMYCIN 250 MG PO TABS: ORAL_TABLET | ORAL | 0 refills | Status: DC

## 2022-09-09 NOTE — Progress Notes (Signed)
Established Patient Office Visit  Subjective:  Patient ID: Elizabeth Frey, female    DOB: Feb 26, 1991  Age: 32 y.o. MRN: [id number]  Chief Complaint  Patient presents with   Follow-up    6 week follow up    Patient is here today for her 6 week follow up.  She has been feeling fairly well, until the last few weeks. Her daughter had gotten sick and she tested herself again earlier this week, and it was still positive.   She asks if we can run another test today to see if it comes back as positive.   She will also likely need additional time on her paperwork for fmla as well.   She does not have additional concerns to discuss today.  Labs are not due today. She does not need refills.   I have reviewed her active problem list, medication list, allergies, notes from last encounter, lab results for her appointment today.     No other concerns at this time.   Past Medical History:  Diagnosis Date   Gallstones    Headache(784.0)    non-migraine; every 1-2 days   History of bilateral breast reduction surgery 07/15/2017   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    Past Surgical History:  Procedure Laterality Date   BREAST REDUCTION SURGERY  08/24/2011   Procedure: MAMMARY REDUCTION BILATERAL (BREAST);  Surgeon: Charlene Brooke, MD;  Location: [facility];  Service: Plastics;  Laterality: Bilateral;   BREAST SURGERY     CESAREAN SECTION     CHOLECYSTECTOMY N/A 07/30/2018   Procedure: LAPAROSCOPIC CHOLECYSTECTOMY;  Surgeon: Benjamine Sprague, DO;  Location: [facility];  Service: General;  Laterality: N/A;   DILATION AND CURETTAGE OF UTERUS  2008   LIPOSUCTION EXTREMITIES     WISDOM TOOTH EXTRACTION      Social History   Socioeconomic History   Marital status: Single    Spouse name: Not on file   Number of children: Not on file   Years of education: Not on file   Highest education level: Not on file  Occupational History   Not on file  Tobacco Use   Smoking status: Former    Smokeless tobacco: Never  Vaping Use   Vaping Use: Never used  Substance and Sexual Activity   Alcohol use: No   Drug use: No   Sexual activity: Yes    Birth control/protection: Other-see comments, Injection  Other Topics Concern   Not on file  Social History Narrative   Not on file   Social Determinants of Health   Financial Resource Strain: Not on file  Food Insecurity: Not on file  Transportation Needs: Not on file  Physical Activity: Not on file  Stress: Not on file  Social Connections: Not on file  Intimate Partner Violence: Not on file    History reviewed. No pertinent family history.  No Known Allergies  Review of Systems  Constitutional:  Positive for malaise/fatigue.  HENT:  Positive for congestion, sinus pain and sore throat.   Respiratory:  Positive for cough.   All other systems reviewed and are negative.      Objective:   BP 128/60   Pulse 68   Ht 5\' 3"  (1.6 m)   Wt 161 lb (73 kg)   SpO2 99%   BMI 28.52 kg/m   Vitals:   09/09/22 1311  BP: 128/60  Pulse: 68  Height: 5\' 3"  (1.6 m)  Weight: 161 lb (73 kg)  SpO2: 99%  BMI (  Calculated): 28.53    Physical Exam Vitals and nursing note reviewed.  Constitutional:      General: She is not in acute distress.    Appearance: Normal appearance. She is normal weight. She is ill-appearing.  HENT:     Head: Normocephalic.     Nose: Congestion and rhinorrhea present.  Eyes:     Extraocular Movements: Extraocular movements intact.     Conjunctiva/sclera: Conjunctivae normal.     Pupils: Pupils are equal, round, and reactive to light.  Cardiovascular:     Rate and Rhythm: Normal rate.  Pulmonary:     Effort: Pulmonary effort is normal.     Breath sounds: Normal breath sounds.  Neurological:     Mental Status: She is alert.      Results for orders placed or performed in visit on 09/09/22  POCT XPERT XPRESS SARS COVID-2/FLU/RSV [id number]  Result Value Ref Range   SARS Coronavirus 2  Negative    FLU A Negative    FLU B Negative    RSV RNA, PCR Negative   POCT Urine Pregnancy  Result Value Ref Range   Preg Test, Ur Negative Negative    Recent Results (from the past 2160 hour(s))  POCT Urine Pregnancy     Status: Normal   Collection Time: 09/09/22  1:49 PM  Result Value Ref Range   Preg Test, Ur Negative Negative  POCT XPERT XPRESS SARS COVID-2/FLU/RSV [id number]     Status: Normal   Collection Time: 09/09/22  3:52 PM  Result Value Ref Range   SARS Coronavirus 2 Negative    FLU A Negative    FLU B Negative    RSV RNA, PCR Negative       Assessment & Plan:   Problem List Items Addressed This Visit     COVID-19 long hauler manifesting chronic fatigue - Primary   Relevant Orders   POCT XPERT XPRESS SARS COVID-2/FLU/RSV [id number] (Completed)   Other Visit Diagnoses     Screening for blood or protein in urine       Relevant Orders   POCT Urine Pregnancy (Completed)   Encounter for contraceptive management, unspecified type       Relevant Medications   medroxyPROGESTERone Acetate SUSY 150 mg       Return in about 2 months (around 11/09/2022) for F/U.   Total time spent: 30 minutes  Mechele Claude, FNP  09/09/2022

## 2022-09-09 NOTE — Patient Instructions (Signed)
  [facility], [address], [place], [place] [address]   Multidisciplinary Long-Term Recovery  The [facility] opened in March 2021 to treat patients experiencing long-term health problems after recovering from the coronavirus. The clinic, once of the first of its kind in the [place] area, will be able to treat and refer patients with a range of post-COVID symptoms. A provider at the clinic will be able to treat more minor issues like prolonged cough on-site, and evaluate and triage patients to lung, kidney, or cardiac specialists if needed.  To make an appointment: Call [phone].  Contact Physician: Dr. Ned Clines

## 2022-09-10 ENCOUNTER — Encounter: Payer: Self-pay | Admitting: Family

## 2022-09-14 ENCOUNTER — Encounter: Payer: Self-pay | Admitting: Family

## 2022-09-27 ENCOUNTER — Encounter: Payer: Self-pay | Admitting: Family

## 2022-10-05 ENCOUNTER — Encounter: Payer: Self-pay | Admitting: Family

## 2022-11-09 ENCOUNTER — Encounter: Payer: Self-pay | Admitting: Family

## 2022-11-09 ENCOUNTER — Ambulatory Visit (INDEPENDENT_AMBULATORY_CARE_PROVIDER_SITE_OTHER): Payer: Medicaid Other | Admitting: Family

## 2022-11-09 VITALS — BP 124/78 | HR 89 | Ht 63.0 in | Wt 153.8 lb

## 2022-11-09 DIAGNOSIS — E559 Vitamin D deficiency, unspecified: Secondary | ICD-10-CM

## 2022-11-09 DIAGNOSIS — R7303 Prediabetes: Secondary | ICD-10-CM | POA: Diagnosis not present

## 2022-11-09 DIAGNOSIS — I10 Essential (primary) hypertension: Secondary | ICD-10-CM

## 2022-11-09 DIAGNOSIS — G35 Multiple sclerosis: Secondary | ICD-10-CM | POA: Diagnosis not present

## 2022-11-09 DIAGNOSIS — U099 Post covid-19 condition, unspecified: Secondary | ICD-10-CM

## 2022-11-09 DIAGNOSIS — R5383 Other fatigue: Secondary | ICD-10-CM

## 2022-11-09 DIAGNOSIS — G9332 Myalgic encephalomyelitis/chronic fatigue syndrome: Secondary | ICD-10-CM | POA: Diagnosis not present

## 2022-11-09 DIAGNOSIS — E538 Deficiency of other specified B group vitamins: Secondary | ICD-10-CM

## 2022-11-09 DIAGNOSIS — E782 Mixed hyperlipidemia: Secondary | ICD-10-CM

## 2022-11-09 NOTE — Assessment & Plan Note (Signed)
Patient has been unable to get in touch with the Long COVID clinic.  I will send referral through Epic to try and get her in with them ASAP. She is still experiencing significant fatigue, headaches, brain fog. She will let me know if she does not hear from them.   Recheck at follow up.

## 2022-11-09 NOTE — Assessment & Plan Note (Signed)
Patient is followed by neurology for treatment of her MS.  She is doing well right now, and has not had any additional flares.   Will defer to them for any changes to medication regimen

## 2022-11-09 NOTE — Progress Notes (Signed)
Established Patient Office Visit  Subjective:  Patient ID: Elizabeth Frey, female    DOB: [birth year]  Age: 32 y.o. MRN: [id number]  Chief Complaint  Patient presents with   Follow-up    2 month follow up    Patient is here today for her 2 months follow up.  She has been feeling fairly well since last appointment.   She does have additional concerns to discuss today.  Labs are due today. She needs refills.   I have reviewed her active problem list, medication list, allergies, notes from last encounter, lab results for her appointment today.    No other concerns at this time.   Past Medical History:  Diagnosis Date   Gallstones    Headache(784.0)    non-migraine; every 1-2 days   History of bilateral breast reduction surgery 07/15/2017   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    Past Surgical History:  Procedure Laterality Date   BREAST REDUCTION SURGERY  08/24/2011   Procedure: MAMMARY REDUCTION BILATERAL (BREAST);  Surgeon: Karie Fetch, MD;  Location: [facility];  Service: Plastics;  Laterality: Bilateral;   BREAST SURGERY     CESAREAN SECTION     CHOLECYSTECTOMY N/A 07/30/2018   Procedure: LAPAROSCOPIC CHOLECYSTECTOMY;  Surgeon: Sung Amabile, DO;  Location: [facility];  Service: General;  Laterality: N/A;   DILATION AND CURETTAGE OF UTERUS  2008   LIPOSUCTION EXTREMITIES     WISDOM TOOTH EXTRACTION      Social History   Socioeconomic History   Marital status: Single    Spouse name: Not on file   Number of children: Not on file   Years of education: Not on file   Highest education level: Not on file  Occupational History   Not on file  Tobacco Use   Smoking status: Former   Smokeless tobacco: Never  Vaping Use   Vaping Use: Never used  Substance and Sexual Activity   Alcohol use: No   Drug use: No   Sexual activity: Yes    Birth control/protection: Other-see comments, Injection  Other Topics Concern   Not on file  Social History Narrative    Not on file   Social Determinants of Health   Financial Resource Strain: Not on file  Food Insecurity: Not on file  Transportation Needs: Not on file  Physical Activity: Not on file  Stress: Not on file  Social Connections: Not on file  Intimate Partner Violence: Not on file    No family history on file.  No Known Allergies  ROS     Objective:   BP 124/78   Pulse 89   Ht 5\' 3"  (1.6 m)   Wt 153 lb 12.8 oz (69.8 kg)   SpO2 99%   BMI 27.24 kg/m   Vitals:   11/09/22 1352  BP: 124/78  Pulse: 89  Height: 5\' 3"  (1.6 m)  Weight: 153 lb 12.8 oz (69.8 kg)  SpO2: 99%  BMI (Calculated): 27.25    Physical Exam   No results found for any visits on 11/09/22.  Recent Results (from the past 2160 hour(s))  POCT Urine Pregnancy     Status: Normal   Collection Time: 09/09/22  1:49 PM  Result Value Ref Range   Preg Test, Ur Negative Negative  POCT XPERT XPRESS SARS COVID-2/FLU/RSV [[id number]]     Status: Normal   Collection Time: 09/09/22  3:52 PM  Result Value Ref Range   SARS Coronavirus 2 Negative    FLU A Negative  FLU B Negative    RSV RNA, PCR Negative        Assessment & Plan:   Problem List Items Addressed This Visit    Problem List Items Addressed This Visit       Active Problems   Vitamin D deficiency    Checking labs today.  Will continue supplements as needed.       Relevant Orders   VITAMIN D 25 Hydroxy (Vit-D Deficiency, Fractures)   CBC With Differential   CMP14+EGFR   MS (multiple sclerosis) (HCC)    Patient is followed by neurology for treatment of her MS.  She is doing well right now, and has not had any additional flares.   Will defer to them for any changes to medication regimen      Relevant Orders   CBC With Differential   CMP14+EGFR   COVID-19 long hauler manifesting chronic fatigue - Primary    Patient has been unable to get in touch with the Long COVID clinic.  I will send referral through Epic to try and get her in with  them ASAP. She is still experiencing significant fatigue, headaches, brain fog. She will let me know if she does not hear from them.   Recheck at follow up.       Relevant Orders   Ambulatory referral to Family Practice   CBC With Differential   CMP14+EGFR   Other Visit Diagnoses     Prediabetes       Checking A1C today Patient reminded of dietary and lifestyle modifications to help prevent progression to diabetes.   Relevant Orders   CBC With Differential   CMP14+EGFR   Hemoglobin A1c   Other fatigue       Relevant Orders   CBC With Differential   CMP14+EGFR   TSH   Iron, TIBC and Ferritin Panel   Essential hypertension, benign       Relevant Orders   CBC With Differential   CMP14+EGFR   Mixed hyperlipidemia       Relevant Orders   Lipid panel   CBC With Differential   CMP14+EGFR   B12 deficiency due to diet       Relevant Orders   CBC With Differential   CMP14+EGFR   Vitamin B12         Return in about 1 month (around 12/10/2022) for F/U.   Total time spent: 20 minutes  Miki Kins, FNP  11/09/2022   This document may have been prepared by [facility] Voice Recognition software and as such may include unintentional dictation errors.

## 2022-11-09 NOTE — Assessment & Plan Note (Signed)
Checking labs today.  Will continue supplements as needed.  

## 2022-11-10 LAB — IRON,TIBC AND FERRITIN PANEL
Ferritin: 119 ng/mL (ref 15–150)
Iron Saturation: 44 % (ref 15–55)
Iron: 113 ug/dL (ref 27–159)
Total Iron Binding Capacity: 259 ug/dL (ref 250–450)
UIBC: 146 ug/dL (ref 131–425)

## 2022-11-10 LAB — CMP14+EGFR
ALT: 18 IU/L (ref 0–32)
AST: 16 IU/L (ref 0–40)
Albumin/Globulin Ratio: 1.4 (ref 1.2–2.2)
Albumin: 4.2 g/dL (ref 3.9–4.9)
Alkaline Phosphatase: 118 IU/L (ref 44–121)
BUN/Creatinine Ratio: 10 (ref 9–23)
BUN: 9 mg/dL (ref 6–20)
Bilirubin Total: 0.7 mg/dL (ref 0.0–1.2)
CO2: 19 mmol/L — ABNORMAL LOW (ref 20–29)
Calcium: 9.3 mg/dL (ref 8.7–10.2)
Chloride: 106 mmol/L (ref 96–106)
Creatinine, Ser: 0.91 mg/dL (ref 0.57–1.00)
Globulin, Total: 2.9 g/dL (ref 1.5–4.5)
Glucose: 65 mg/dL — ABNORMAL LOW (ref 70–99)
Potassium: 3.5 mmol/L (ref 3.5–5.2)
Sodium: 140 mmol/L (ref 134–144)
Total Protein: 7.1 g/dL (ref 6.0–8.5)
eGFR: 85 mL/min/{1.73_m2} (ref >59)

## 2022-11-10 LAB — CBC WITH DIFFERENTIAL
Basophils Absolute: 0 10*3/uL (ref 0.0–0.2)
Basos: 1 %
EOS (ABSOLUTE): 0.1 10*3/uL (ref 0.0–0.4)
Eos: 1 %
Hematocrit: 40.1 % (ref 34.0–46.6)
Hemoglobin: 13.4 g/dL (ref 11.1–15.9)
Immature Grans (Abs): 0 10*3/uL (ref 0.0–0.1)
Immature Granulocytes: 0 %
Lymphocytes Absolute: 2.9 10*3/uL (ref 0.7–3.1)
Lymphs: 43 %
MCH: 33 pg (ref 26.6–33.0)
MCHC: 33.4 g/dL (ref 31.5–35.7)
MCV: 99 fL — ABNORMAL HIGH (ref 79–97)
Monocytes Absolute: 0.5 10*3/uL (ref 0.1–0.9)
Monocytes: 7 %
Neutrophils Absolute: 3.3 10*3/uL (ref 1.4–7.0)
Neutrophils: 48 %
RBC: 4.06 x10E6/uL (ref 3.77–5.28)
RDW: 11.8 % (ref 11.7–15.4)
WBC: 6.8 10*3/uL (ref 3.4–10.8)

## 2022-11-10 LAB — LIPID PANEL
Chol/HDL Ratio: 4.8 ratio — ABNORMAL HIGH (ref 0.0–4.4)
Cholesterol, Total: 148 mg/dL (ref 100–199)
HDL: 31 mg/dL — ABNORMAL LOW (ref >39)
LDL Chol Calc (NIH): 102 mg/dL — ABNORMAL HIGH (ref 0–99)
Triglycerides: 76 mg/dL (ref 0–149)
VLDL Cholesterol Cal: 15 mg/dL (ref 5–40)

## 2022-11-10 LAB — TSH: TSH: 0.739 u[IU]/mL (ref 0.450–4.500)

## 2022-11-10 LAB — VITAMIN D 25 HYDROXY (VIT D DEFICIENCY, FRACTURES): Vit D, 25-Hydroxy: 16.9 ng/mL — ABNORMAL LOW (ref 30.0–100.0)

## 2022-11-10 LAB — HEMOGLOBIN A1C
Est. average glucose Bld gHb Est-mCnc: 105 mg/dL
Hgb A1c MFr Bld: 5.3 % (ref 4.8–5.6)

## 2022-11-10 LAB — VITAMIN B12: Vitamin B-12: 337 pg/mL (ref 232–1245)

## 2022-11-11 NOTE — Telephone Encounter (Signed)
Discussed at follow up

## 2022-11-16 ENCOUNTER — Encounter: Payer: Self-pay | Admitting: Family

## 2022-12-03 ENCOUNTER — Other Ambulatory Visit: Payer: Self-pay | Admitting: Family

## 2022-12-05 ENCOUNTER — Other Ambulatory Visit: Payer: Self-pay | Admitting: Family

## 2022-12-05 DIAGNOSIS — K2101 Gastro-esophageal reflux disease with esophagitis, with bleeding: Secondary | ICD-10-CM

## 2022-12-10 ENCOUNTER — Ambulatory Visit: Payer: Medicaid Other | Admitting: Family

## 2022-12-10 VITALS — BP 125/75 | HR 71 | Ht 63.0 in | Wt 156.6 lb

## 2022-12-10 DIAGNOSIS — E559 Vitamin D deficiency, unspecified: Secondary | ICD-10-CM | POA: Diagnosis not present

## 2022-12-10 DIAGNOSIS — G9332 Myalgic encephalomyelitis/chronic fatigue syndrome: Secondary | ICD-10-CM

## 2022-12-10 DIAGNOSIS — G35 Multiple sclerosis: Secondary | ICD-10-CM | POA: Diagnosis not present

## 2022-12-10 DIAGNOSIS — Z309 Encounter for contraceptive management, unspecified: Secondary | ICD-10-CM

## 2022-12-10 DIAGNOSIS — U099 Post covid-19 condition, unspecified: Secondary | ICD-10-CM

## 2022-12-10 MED ORDER — MEDROXYPROGESTERONE ACETATE 150 MG/ML IM SUSY
150.0000 mg | PREFILLED_SYRINGE | INTRAMUSCULAR | Status: AC
Start: 2022-12-10 — End: 2022-12-10
  Administered 2022-12-10: 150 mg via INTRAMUSCULAR

## 2022-12-17 ENCOUNTER — Encounter: Payer: Self-pay | Admitting: Family

## 2022-12-17 NOTE — Progress Notes (Signed)
Established Patient Office Visit  Subjective:  Patient ID: Elizabeth Frey, female    DOB: 04/15/1991  Age: 32 y.o. MRN: [id number]  Chief Complaint  Patient presents with   Follow-up    1 mo F/U    Patient is here today for her 1 month follow up.  She has been feeling fairly well since last appointment.   She does not have additional concerns to discuss today.   Still needs referral to the long COVID clinic, she has not been able to get to one yet, as the one we previously sent the referral to is not answering phone calls  Labs are due today. Also due for her depo shot.   She needs refills.   I have reviewed her active problem list, medication list, allergies, notes from last encounter, lab results for her appointment today.      No other concerns at this time.   Past Medical History:  Diagnosis Date   Gallstones    Headache(784.0)    non-migraine; every 1-2 days   History of bilateral breast reduction surgery 07/15/2017   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    Past Surgical History:  Procedure Laterality Date   BREAST REDUCTION SURGERY  08/24/2011   Procedure: MAMMARY REDUCTION BILATERAL (BREAST);  Surgeon: Karie Fetch, MD;  Location: [facility];  Service: Plastics;  Laterality: Bilateral;   BREAST SURGERY     CESAREAN SECTION     CHOLECYSTECTOMY N/A 07/30/2018   Procedure: LAPAROSCOPIC CHOLECYSTECTOMY;  Surgeon: Sung Amabile, DO;  Location: [facility];  Service: General;  Laterality: N/A;   DILATION AND CURETTAGE OF UTERUS  2008   LIPOSUCTION EXTREMITIES     WISDOM TOOTH EXTRACTION      Social History   Socioeconomic History   Marital status: Single    Spouse name: Not on file   Number of children: Not on file   Years of education: Not on file   Highest education level: Not on file  Occupational History   Not on file  Tobacco Use   Smoking status: Former   Smokeless tobacco: Never  Vaping Use   Vaping Use: Never used  Substance and Sexual  Activity   Alcohol use: No   Drug use: No   Sexual activity: Yes    Birth control/protection: Other-see comments, Injection  Other Topics Concern   Not on file  Social History Narrative   Not on file   Social Determinants of Health   Financial Resource Strain: Not on file  Food Insecurity: Not on file  Transportation Needs: Not on file  Physical Activity: Not on file  Stress: Not on file  Social Connections: Not on file  Intimate Partner Violence: Not on file    History reviewed. No pertinent family history.  No Known Allergies  Review of Systems  Constitutional:  Positive for malaise/fatigue.  All other systems reviewed and are negative.      Objective:   BP 125/75   Pulse 71   Ht 5\' 3"  (1.6 m)   Wt 156 lb 9.6 oz (71 kg)   SpO2 99%   BMI 27.74 kg/m   Vitals:   12/10/22 1349  BP: 125/75  Pulse: 71  Height: 5\' 3"  (1.6 m)  Weight: 156 lb 9.6 oz (71 kg)  SpO2: 99%  BMI (Calculated): 27.75    Physical Exam Vitals and nursing note reviewed.  Constitutional:      General: She is not in acute distress.    Appearance: Normal  appearance. She is normal weight. She is not ill-appearing.  HENT:     Head: Normocephalic.  Eyes:     Pupils: Pupils are equal, round, and reactive to light.  Cardiovascular:     Rate and Rhythm: Normal rate.  Pulmonary:     Effort: Pulmonary effort is normal.  Neurological:     General: No focal deficit present.     Mental Status: She is alert and oriented to person, place, and time. Mental status is at baseline.  Psychiatric:        Mood and Affect: Mood normal.        Behavior: Behavior normal.        Thought Content: Thought content normal.        Judgment: Judgment normal.      No results found for any visits on 12/10/22.  Recent Results (from the past 2160 hour(s))  Lipid panel     Status: Abnormal   Collection Time: 11/09/22  2:34 PM  Result Value Ref Range   Cholesterol, Total 148 100 - 199 mg/dL   Triglycerides  76 0 - 149 mg/dL   HDL 31 (L) >32 mg/dL   VLDL Cholesterol Cal 15 5 - 40 mg/dL   LDL Chol Calc (NIH) 440 (H) 0 - 99 mg/dL   Chol/HDL Ratio 4.8 (H) 0.0 - 4.4 ratio    Comment:                                   T. Chol/HDL Ratio                                             Men  Women                               1/2 Avg.Risk  3.4    3.3                                   Avg.Risk  5.0    4.4                                2X Avg.Risk  9.6    7.1                                3X Avg.Risk 23.4   11.0   VITAMIN D 25 Hydroxy (Vit-D Deficiency, Fractures)     Status: Abnormal   Collection Time: 11/09/22  2:34 PM  Result Value Ref Range   Vit D, 25-Hydroxy 16.9 (L) 30.0 - 100.0 ng/mL    Comment: Vitamin D deficiency has been defined by the Institute of Medicine and an Endocrine Society practice guideline as a level of serum 25-OH vitamin D less than 20 ng/mL (1,2). The Endocrine Society went on to further define vitamin D insufficiency as a level between 21 and 29 ng/mL (2). 1. IOM (Institute of Medicine). 2010. Dietary reference    intakes for calcium and D. [place]: The    [employer]. 2. Holick MF, Binkley [place], Bischoff-Ferrari HA,  et al.    Evaluation, treatment, and prevention of vitamin D    deficiency: an Endocrine Society clinical practice    guideline. JCEM. 2011 Jul; 96(7):1911-30.   CBC With Differential     Status: Abnormal   Collection Time: 11/09/22  2:34 PM  Result Value Ref Range   WBC 6.8 3.4 - 10.8 x10E3/uL   RBC 4.06 3.77 - 5.28 x10E6/uL   Hemoglobin 13.4 11.1 - 15.9 g/dL   Hematocrit 16.1 09.6 - 46.6 %   MCV 99 (H) 79 - 97 fL   MCH 33.0 26.6 - 33.0 pg   MCHC 33.4 31.5 - 35.7 g/dL   RDW 04.5 40.9 - 81.1 %   Neutrophils 48 Not Estab. %   Lymphs 43 Not Estab. %   Monocytes 7 Not Estab. %   Eos 1 Not Estab. %   Basos 1 Not Estab. %   Neutrophils Absolute 3.3 1.4 - 7.0 x10E3/uL   Lymphocytes Absolute 2.9 0.7 - 3.1 x10E3/uL   Monocytes Absolute  0.5 0.1 - 0.9 x10E3/uL   EOS (ABSOLUTE) 0.1 0.0 - 0.4 x10E3/uL   Basophils Absolute 0.0 0.0 - 0.2 x10E3/uL   Immature Granulocytes 0 Not Estab. %   Immature Grans (Abs) 0.0 0.0 - 0.1 x10E3/uL  CMP14+EGFR     Status: Abnormal   Collection Time: 11/09/22  2:34 PM  Result Value Ref Range   Glucose 65 (L) 70 - 99 mg/dL   BUN 9 6 - 20 mg/dL   Creatinine, Ser 9.14 0.57 - 1.00 mg/dL   eGFR 85 >78 GN/FAO/1.30   BUN/Creatinine Ratio 10 9 - 23   Sodium 140 134 - 144 mmol/L   Potassium 3.5 3.5 - 5.2 mmol/L   Chloride 106 96 - 106 mmol/L   CO2 19 (L) 20 - 29 mmol/L   Calcium 9.3 8.7 - 10.2 mg/dL   Total Protein 7.1 6.0 - 8.5 g/dL   Albumin 4.2 3.9 - 4.9 g/dL   Globulin, Total 2.9 1.5 - 4.5 g/dL   Albumin/Globulin Ratio 1.4 1.2 - 2.2   Bilirubin Total 0.7 0.0 - 1.2 mg/dL   Alkaline Phosphatase 118 44 - 121 IU/L   AST 16 0 - 40 IU/L   ALT 18 0 - 32 IU/L  TSH     Status: None   Collection Time: 11/09/22  2:34 PM  Result Value Ref Range   TSH 0.739 0.450 - 4.500 uIU/mL  Hemoglobin A1c     Status: None   Collection Time: 11/09/22  2:34 PM  Result Value Ref Range   Hgb A1c MFr Bld 5.3 4.8 - 5.6 %    Comment:          Prediabetes: 5.7 - 6.4          Diabetes: >6.4          Glycemic control for adults with diabetes: <7.0    Est. average glucose Bld gHb Est-mCnc 105 mg/dL  Vitamin Q65     Status: None   Collection Time: 11/09/22  2:34 PM  Result Value Ref Range   Vitamin B-12 337 232 - 1,245 pg/mL  Iron, TIBC and Ferritin Panel     Status: None   Collection Time: 11/09/22  2:34 PM  Result Value Ref Range   Total Iron Binding Capacity 259 250 - 450 ug/dL   UIBC 784 696 - 295 ug/dL   Iron 284 27 - 132 ug/dL   Iron Saturation 44 15 - 55 %   Ferritin 119  15 - 150 ng/mL       Assessment & Plan:   Problem List Items Addressed This Visit       Active Problems   Vitamin D deficiency    Checking labs today.  Will continue supplements as needed.       MS (multiple sclerosis) (HCC)     Patient stable.  Well controlled with current therapy.   Continue current meds.       COVID-19 long hauler manifesting chronic fatigue - Primary    Will send referral to the new clinic.         Other Visit Diagnoses     Encounter for contraceptive management, unspecified type       Relevant Medications   medroxyPROGESTERone Acetate SUSY 150 mg (Completed)       Return in about 3 months (around 03/12/2023).   Total time spent: 20 minutes  Miki Kins, FNP  12/10/2022   This document may have been prepared by [facility] Voice Recognition software and as such may include unintentional dictation errors.

## 2022-12-17 NOTE — Assessment & Plan Note (Signed)
Checking labs today.  Will continue supplements as needed.  

## 2022-12-17 NOTE — Assessment & Plan Note (Signed)
Patient stable.  Well controlled with current therapy.   Continue current meds.  

## 2022-12-17 NOTE — Assessment & Plan Note (Signed)
Will send referral to the new clinic.

## 2023-06-23 ENCOUNTER — Encounter: Payer: Self-pay | Admitting: Family

## 2023-06-23 ENCOUNTER — Ambulatory Visit: Payer: Medicaid Other | Admitting: Family

## 2023-06-23 VITALS — BP 123/76 | HR 75 | Ht 63.0 in | Wt 145.0 lb

## 2023-06-23 DIAGNOSIS — R7303 Prediabetes: Secondary | ICD-10-CM

## 2023-06-23 DIAGNOSIS — Z1159 Encounter for screening for other viral diseases: Secondary | ICD-10-CM

## 2023-06-23 DIAGNOSIS — I10 Essential (primary) hypertension: Secondary | ICD-10-CM | POA: Diagnosis not present

## 2023-06-23 DIAGNOSIS — Z3042 Encounter for surveillance of injectable contraceptive: Secondary | ICD-10-CM | POA: Diagnosis not present

## 2023-06-23 DIAGNOSIS — E559 Vitamin D deficiency, unspecified: Secondary | ICD-10-CM

## 2023-06-23 DIAGNOSIS — R5383 Other fatigue: Secondary | ICD-10-CM

## 2023-06-23 DIAGNOSIS — E782 Mixed hyperlipidemia: Secondary | ICD-10-CM

## 2023-06-23 DIAGNOSIS — G35 Multiple sclerosis: Secondary | ICD-10-CM | POA: Diagnosis not present

## 2023-06-23 DIAGNOSIS — E538 Deficiency of other specified B group vitamins: Secondary | ICD-10-CM

## 2023-06-23 LAB — POCT URINE PREGNANCY: Preg Test, Ur: NEGATIVE

## 2023-06-23 NOTE — Progress Notes (Signed)
 Established Patient Office Visit  Subjective:  Patient ID: Elizabeth Frey, female    DOB: 08-25-1990  Age: 33 y.o. MRN: [id number]  Chief Complaint  Patient presents with   Follow-up    Patient is here today for her 3 months follow up.  She has been feeling fairly well since last appointment.   She does not have additional concerns to discuss today.  Due for her depo shot.   Labs are due today. She needs refills.   I have reviewed her active problem list, medication list, allergies, notes from last encounter, lab results for her appointment today.      No other concerns at this time.   Past Medical History:  Diagnosis Date   Gallstones    Headache(784.0)    non-migraine; every 1-2 days   History of bilateral breast reduction surgery 07/15/2017   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    Past Surgical History:  Procedure Laterality Date   BREAST REDUCTION SURGERY  08/24/2011   Procedure: MAMMARY REDUCTION BILATERAL (BREAST);  Surgeon: Ronal DELENA Mage, MD;  [facility]:  [facility];  Service: Plastics;  Laterality: Bilateral;   BREAST SURGERY     CESAREAN SECTION     CHOLECYSTECTOMY N/A 07/30/2018   Procedure: LAPAROSCOPIC CHOLECYSTECTOMY;  Surgeon: Tye Millet, DO;  Location: [facility];  Service: General;  Laterality: N/A;   DILATION AND CURETTAGE OF UTERUS  2008   LIPOSUCTION EXTREMITIES     WISDOM TOOTH EXTRACTION      Social History   Socioeconomic History   Marital status: Single    Spouse name: Not on file   Number of children: Not on file   Years of education: Not on file   Highest education level: Not on file  Occupational History   Not on file  Tobacco Use   Smoking status: Former   Smokeless tobacco: Never  Vaping Use   Vaping status: Never Used  Substance and Sexual Activity   Alcohol use: No   Drug use: No   Sexual activity: Yes    Birth control/protection: Other-see comments, Injection  Other Topics Concern   Not on file  Social  History Narrative   Not on file   Social Drivers of Health   Financial Resource Strain: Not on file  Food Insecurity: Not on file  Transportation Needs: Not on file  Physical Activity: Not on file  Stress: Not on file  Social Connections: Not on file  Intimate Partner Violence: Not on file    History reviewed. No pertinent family history.  No Known Allergies  Review of Systems  All other systems reviewed and are negative.      Objective:   BP 123/76   Pulse 75   Ht 5' 3 (1.6 m)   Wt 145 lb (65.8 kg)   SpO2 99%   BMI 25.69 kg/m   Vitals:   06/23/23 1354  BP: 123/76  Pulse: 75  Height: 5' 3 (1.6 m)  Weight: 145 lb (65.8 kg)  SpO2: 99%  BMI (Calculated): 25.69    Physical Exam Vitals reviewed.  Constitutional:      Appearance: Normal appearance. She is normal weight.  Neurological:     General: No focal deficit present.     Mental Status: She is alert and oriented to person, place, and time. Mental status is at baseline.  Psychiatric:        Mood and Affect: Mood normal.        Behavior: Behavior normal.  Thought Content: Thought content normal.        Judgment: Judgment normal.      Results for orders placed or performed in visit on 06/23/23  Lipid panel  Result Value Ref Range   Cholesterol, Total 154 100 - 199 mg/dL   Triglycerides 97 0 - 149 mg/dL   HDL 28 (L) >60 mg/dL   VLDL Cholesterol Cal 18 5 - 40 mg/dL   LDL Chol Calc (NIH) 891 (H) 0 - 99 mg/dL   Chol/HDL Ratio 5.5 (H) 0.0 - 4.4 ratio  VITAMIN D  25 Hydroxy (Vit-D Deficiency, Fractures)  Result Value Ref Range   Vit D, 25-Hydroxy 12.7 (L) 30.0 - 100.0 ng/mL  CMP14+EGFR  Result Value Ref Range   Glucose 83 70 - 99 mg/dL   BUN 12 6 - 20 mg/dL   Creatinine, Ser 9.18 0.57 - 1.00 mg/dL   eGFR 98 >40 fO/fpw/8.26   BUN/Creatinine Ratio 15 9 - 23   Sodium 138 134 - 144 mmol/L   Potassium 4.1 3.5 - 5.2 mmol/L   Chloride 107 (H) 96 - 106 mmol/L   CO2 20 20 - 29 mmol/L   Calcium   8.7 8.7 - 10.2 mg/dL   Total Protein 7.0 6.0 - 8.5 g/dL   Albumin 4.1 3.9 - 4.9 g/dL   Globulin, Total 2.9 1.5 - 4.5 g/dL   Bilirubin Total 0.3 0.0 - 1.2 mg/dL   Alkaline Phosphatase 128 (H) 44 - 121 IU/L   AST 13 0 - 40 IU/L   ALT 14 0 - 32 IU/L  TSH  Result Value Ref Range   TSH 0.995 0.450 - 4.500 uIU/mL  Hemoglobin A1c  Result Value Ref Range   Hgb A1c MFr Bld 5.2 4.8 - 5.6 %   Est. average glucose Bld gHb Est-mCnc 103 mg/dL  Vitamin A87  Result Value Ref Range   Vitamin B-12 391 232 - 1,245 pg/mL  CBC with Diff  Result Value Ref Range   WBC 6.9 3.4 - 10.8 x10E3/uL   RBC 4.19 3.77 - 5.28 x10E6/uL   Hemoglobin 13.9 11.1 - 15.9 g/dL   Hematocrit 57.9 65.9 - 46.6 %   MCV 100 (H) 79 - 97 fL   MCH 33.2 (H) 26.6 - 33.0 pg   MCHC 33.1 31.5 - 35.7 g/dL   RDW 88.4 (L) 88.2 - 84.5 %   Platelets 142 (L) 150 - 450 x10E3/uL   Neutrophils 51 Not Estab. %   Lymphs 40 Not Estab. %   Monocytes 6 Not Estab. %   Eos 2 Not Estab. %   Basos 1 Not Estab. %   Neutrophils Absolute 3.5 1.4 - 7.0 x10E3/uL   Lymphocytes Absolute 2.8 0.7 - 3.1 x10E3/uL   Monocytes Absolute 0.4 0.1 - 0.9 x10E3/uL   EOS (ABSOLUTE) 0.1 0.0 - 0.4 x10E3/uL   Basophils Absolute 0.0 0.0 - 0.2 x10E3/uL   Immature Granulocytes 0 Not Estab. %   Immature Grans (Abs) 0.0 0.0 - 0.1 x10E3/uL  Hepatitis C Ab reflex to Quant PCR  Result Value Ref Range   HCV Ab Non Reactive Non Reactive  Interpretation:  Result Value Ref Range   HCV Interp 1: Comment   POCT Urine Pregnancy  Result Value Ref Range   Preg Test, Ur Negative Negative    Recent Results (from the past 2160 hours)  POCT Urine Pregnancy     Status: None   Collection Time: 06/23/23  2:22 PM  Result Value Ref Range   Preg Test, Ur  Negative Negative  Lipid panel     Status: Abnormal   Collection Time: 07/05/23  9:29 AM  Result Value Ref Range   Cholesterol, Total 154 100 - 199 mg/dL   Triglycerides 97 0 - 149 mg/dL   HDL 28 (L) >60 mg/dL   VLDL  Cholesterol Cal 18 5 - 40 mg/dL   LDL Chol Calc (NIH) 891 (H) 0 - 99 mg/dL   Chol/HDL Ratio 5.5 (H) 0.0 - 4.4 ratio    Comment:                                   T. Chol/HDL Ratio                                             Men  Women                               1/2 Avg.Risk  3.4    3.3                                   Avg.Risk  5.0    4.4                                2X Avg.Risk  9.6    7.1                                3X Avg.Risk 23.4   11.0   VITAMIN D  25 Hydroxy (Vit-D Deficiency, Fractures)     Status: Abnormal   Collection Time: 07/05/23  9:29 AM  Result Value Ref Range   Vit D, 25-Hydroxy 12.7 (L) 30.0 - 100.0 ng/mL    Comment: Vitamin D  deficiency has been defined by the Institute of Medicine and an Endocrine Society practice guideline as a level of serum 25-OH vitamin D  less than 20 ng/mL (1,2). The Endocrine Society went on to further define vitamin D  insufficiency as a level between 21 and 29 ng/mL (2). 1. IOM (Institute of Medicine). 2010. Dietary reference    intakes for calcium  and D. [place]: The    [employer]. 2. Holick MF, Binkley Henderson, Bischoff-Ferrari HA, et al.    Evaluation, treatment, and prevention of vitamin D     deficiency: an Endocrine Society clinical practice    guideline. JCEM. 2011 Jul; 96(7):1911-30.   CMP14+EGFR     Status: Abnormal   Collection Time: 07/05/23  9:29 AM  Result Value Ref Range   Glucose 83 70 - 99 mg/dL   BUN 12 6 - 20 mg/dL   Creatinine, Ser 9.18 0.57 - 1.00 mg/dL   eGFR 98 >40 fO/fpw/8.26   BUN/Creatinine Ratio 15 9 - 23   Sodium 138 134 - 144 mmol/L   Potassium 4.1 3.5 - 5.2 mmol/L   Chloride 107 (H) 96 - 106 mmol/L   CO2 20 20 - 29 mmol/L   Calcium  8.7 8.7 - 10.2 mg/dL   Total Protein 7.0 6.0 - 8.5 g/dL   Albumin 4.1 3.9 - 4.9 g/dL   Globulin,  Total 2.9 1.5 - 4.5 g/dL   Bilirubin Total 0.3 0.0 - 1.2 mg/dL   Alkaline Phosphatase 128 (H) 44 - 121 IU/L   AST 13 0 - 40 IU/L   ALT 14 0 - 32 IU/L   TSH     Status: None   Collection Time: 07/05/23  9:29 AM  Result Value Ref Range   TSH 0.995 0.450 - 4.500 uIU/mL  Hemoglobin A1c     Status: None   Collection Time: 07/05/23  9:29 AM  Result Value Ref Range   Hgb A1c MFr Bld 5.2 4.8 - 5.6 %    Comment:          Prediabetes: 5.7 - 6.4          Diabetes: >6.4          Glycemic control for adults with diabetes: <7.0    Est. average glucose Bld gHb Est-mCnc 103 mg/dL  Vitamin A87     Status: None   Collection Time: 07/05/23  9:29 AM  Result Value Ref Range   Vitamin B-12 391 232 - 1,245 pg/mL  CBC with Diff     Status: Abnormal   Collection Time: 07/05/23  9:29 AM  Result Value Ref Range   WBC 6.9 3.4 - 10.8 x10E3/uL   RBC 4.19 3.77 - 5.28 x10E6/uL   Hemoglobin 13.9 11.1 - 15.9 g/dL   Hematocrit 57.9 65.9 - 46.6 %   MCV 100 (H) 79 - 97 fL   MCH 33.2 (H) 26.6 - 33.0 pg   MCHC 33.1 31.5 - 35.7 g/dL   RDW 88.4 (L) 88.2 - 84.5 %   Platelets 142 (L) 150 - 450 x10E3/uL   Neutrophils 51 Not Estab. %   Lymphs 40 Not Estab. %   Monocytes 6 Not Estab. %   Eos 2 Not Estab. %   Basos 1 Not Estab. %   Neutrophils Absolute 3.5 1.4 - 7.0 x10E3/uL   Lymphocytes Absolute 2.8 0.7 - 3.1 x10E3/uL   Monocytes Absolute 0.4 0.1 - 0.9 x10E3/uL   EOS (ABSOLUTE) 0.1 0.0 - 0.4 x10E3/uL   Basophils Absolute 0.0 0.0 - 0.2 x10E3/uL   Immature Granulocytes 0 Not Estab. %   Immature Grans (Abs) 0.0 0.0 - 0.1 x10E3/uL  Hepatitis C Ab reflex to Quant PCR     Status: None   Collection Time: 07/05/23  9:29 AM  Result Value Ref Range   HCV Ab Non Reactive Non Reactive  Interpretation:     Status: None   Collection Time: 07/05/23  9:29 AM  Result Value Ref Range   HCV Interp 1: Comment     Comment: Not infected with HCV unless early or acute infection is suspected (which may be delayed in an immunocompromised individual), or other evidence exists to indicate HCV infection.        Assessment & Plan:   Problem List Items Addressed This Visit        Nervous and Auditory   MS (multiple sclerosis) (HCC)   Patient stable.  Well controlled with current therapy.   Continue current meds.       Relevant Orders   CMP14+EGFR (Completed)   CBC with Diff (Completed)     Other   Vitamin D  deficiency - Primary   Checking labs today.  Will continue supplements as needed.       Relevant Orders   VITAMIN D  25 Hydroxy (Vit-D Deficiency, Fractures) (Completed)   CMP14+EGFR (Completed)   CBC with Diff (Completed)  Other Visit Diagnoses       Prediabetes       A1C is in prediabetic ranges. Patient counseled on dietary choices and verbalized understanding. Will reassess at follow up after next lab check.   Relevant Orders   CMP14+EGFR (Completed)   Hemoglobin A1c (Completed)   CBC with Diff (Completed)     B12 deficiency due to diet       Checking labs today.  Will continue supplements as needed.   Relevant Orders   CMP14+EGFR (Completed)   Vitamin B12 (Completed)   CBC with Diff (Completed)     Essential hypertension, benign       Blood pressure well controlled with current medications.  Continue current therapy.  Will reassess at follow up.   Relevant Orders   CMP14+EGFR (Completed)   CBC with Diff (Completed)     Mixed hyperlipidemia       Checking labs today.  Continue current therapy for lipid control. Will modify as needed based on labwork results.   Relevant Orders   Lipid panel (Completed)   CMP14+EGFR (Completed)   CBC with Diff (Completed)     Other fatigue       Relevant Orders   CMP14+EGFR (Completed)   TSH (Completed)   CBC with Diff (Completed)     Need for hepatitis C screening test       Test ordered in office today. Will call with results.   Relevant Orders   CMP14+EGFR (Completed)   CBC with Diff (Completed)   Hepatitis C Ab reflex to Quant PCR (Completed)     Encounter for surveillance of injectable contraceptive       Depo shot given in office today.  UPT in office negative.   Relevant  Orders   POCT Urine Pregnancy (Completed)       Return in about 6 months (around 12/21/2023).   Total time spent: 20 minutes  ALAN CHRISTELLA ARRANT, FNP  06/23/2023   This document may have been prepared by [facility] Voice Recognition software and as such may include unintentional dictation errors.

## 2023-07-05 ENCOUNTER — Other Ambulatory Visit: Payer: Medicaid Other

## 2023-07-06 LAB — LIPID PANEL
Chol/HDL Ratio: 5.5 {ratio} — ABNORMAL HIGH (ref 0.0–4.4)
Cholesterol, Total: 154 mg/dL (ref 100–199)
HDL: 28 mg/dL — ABNORMAL LOW (ref >39)
LDL Chol Calc (NIH): 108 mg/dL — ABNORMAL HIGH (ref 0–99)
Triglycerides: 97 mg/dL (ref 0–149)
VLDL Cholesterol Cal: 18 mg/dL (ref 5–40)

## 2023-07-06 LAB — CMP14+EGFR
ALT: 14 [IU]/L (ref 0–32)
AST: 13 [IU]/L (ref 0–40)
Albumin: 4.1 g/dL (ref 3.9–4.9)
Alkaline Phosphatase: 128 [IU]/L — ABNORMAL HIGH (ref 44–121)
BUN/Creatinine Ratio: 15 (ref 9–23)
BUN: 12 mg/dL (ref 6–20)
Bilirubin Total: 0.3 mg/dL (ref 0.0–1.2)
CO2: 20 mmol/L (ref 20–29)
Calcium: 8.7 mg/dL (ref 8.7–10.2)
Chloride: 107 mmol/L — ABNORMAL HIGH (ref 96–106)
Creatinine, Ser: 0.81 mg/dL (ref 0.57–1.00)
Globulin, Total: 2.9 g/dL (ref 1.5–4.5)
Glucose: 83 mg/dL (ref 70–99)
Potassium: 4.1 mmol/L (ref 3.5–5.2)
Sodium: 138 mmol/L (ref 134–144)
Total Protein: 7 g/dL (ref 6.0–8.5)
eGFR: 98 mL/min/{1.73_m2} (ref >59)

## 2023-07-06 LAB — CBC WITH DIFFERENTIAL/PLATELET
Basophils Absolute: 0 10*3/uL (ref 0.0–0.2)
Basos: 1 %
EOS (ABSOLUTE): 0.1 10*3/uL (ref 0.0–0.4)
Eos: 2 %
Hematocrit: 42 % (ref 34.0–46.6)
Hemoglobin: 13.9 g/dL (ref 11.1–15.9)
Immature Grans (Abs): 0 10*3/uL (ref 0.0–0.1)
Immature Granulocytes: 0 %
Lymphocytes Absolute: 2.8 10*3/uL (ref 0.7–3.1)
Lymphs: 40 %
MCH: 33.2 pg — ABNORMAL HIGH (ref 26.6–33.0)
MCHC: 33.1 g/dL (ref 31.5–35.7)
MCV: 100 fL — ABNORMAL HIGH (ref 79–97)
Monocytes Absolute: 0.4 10*3/uL (ref 0.1–0.9)
Monocytes: 6 %
Neutrophils Absolute: 3.5 10*3/uL (ref 1.4–7.0)
Neutrophils: 51 %
Platelets: 142 10*3/uL — ABNORMAL LOW (ref 150–450)
RBC: 4.19 x10E6/uL (ref 3.77–5.28)
RDW: 11.5 % — ABNORMAL LOW (ref 11.7–15.4)
WBC: 6.9 10*3/uL (ref 3.4–10.8)

## 2023-07-06 LAB — VITAMIN B12: Vitamin B-12: 391 pg/mL (ref 232–1245)

## 2023-07-06 LAB — VITAMIN D 25 HYDROXY (VIT D DEFICIENCY, FRACTURES): Vit D, 25-Hydroxy: 12.7 ng/mL — ABNORMAL LOW (ref 30.0–100.0)

## 2023-07-06 LAB — HCV INTERPRETATION

## 2023-07-06 LAB — HEMOGLOBIN A1C
Est. average glucose Bld gHb Est-mCnc: 103 mg/dL
Hgb A1c MFr Bld: 5.2 % (ref 4.8–5.6)

## 2023-07-06 LAB — HCV AB W REFLEX TO QUANT PCR: HCV Ab: NONREACTIVE

## 2023-07-06 LAB — TSH: TSH: 0.995 u[IU]/mL (ref 0.450–4.500)

## 2023-07-17 ENCOUNTER — Encounter: Payer: Self-pay | Admitting: Family

## 2023-07-17 ENCOUNTER — Other Ambulatory Visit: Payer: Self-pay | Admitting: Family

## 2023-07-18 ENCOUNTER — Other Ambulatory Visit: Payer: Self-pay

## 2023-08-21 ENCOUNTER — Encounter: Payer: Self-pay | Admitting: Family

## 2023-08-21 NOTE — Assessment & Plan Note (Signed)
 Patient stable.  Well controlled with current therapy.   Continue current meds.

## 2023-08-21 NOTE — Assessment & Plan Note (Signed)
 Checking labs today.  Will continue supplements as needed.

## 2023-09-21 ENCOUNTER — Ambulatory Visit: Payer: Medicaid Other

## 2023-12-15 ENCOUNTER — Other Ambulatory Visit: Payer: Self-pay

## 2023-12-16 MED ORDER — MEDROXYPROGESTERONE ACETATE 150 MG/ML IM SUSP: 150.0000 mg | INTRAMUSCULAR | 3 refills | Status: AC

## 2023-12-21 ENCOUNTER — Encounter: Payer: Self-pay | Admitting: Family

## 2023-12-21 ENCOUNTER — Ambulatory Visit: Admitting: Family

## 2023-12-21 VITALS — BP 117/76 | HR 91 | Ht 63.0 in | Wt 142.6 lb

## 2023-12-21 DIAGNOSIS — Z0130 Encounter for examination of blood pressure without abnormal findings: Secondary | ICD-10-CM

## 2023-12-21 DIAGNOSIS — Z01419 Encounter for gynecological examination (general) (routine) without abnormal findings: Secondary | ICD-10-CM

## 2023-12-21 DIAGNOSIS — Z0001 Encounter for general adult medical examination with abnormal findings: Secondary | ICD-10-CM | POA: Diagnosis not present

## 2023-12-21 DIAGNOSIS — Z1272 Encounter for screening for malignant neoplasm of vagina: Secondary | ICD-10-CM

## 2023-12-21 DIAGNOSIS — R87619 Unspecified abnormal cytological findings in specimens from cervix uteri: Secondary | ICD-10-CM

## 2023-12-21 DIAGNOSIS — Z0000 Encounter for general adult medical examination without abnormal findings: Secondary | ICD-10-CM

## 2023-12-21 DIAGNOSIS — N760 Acute vaginitis: Secondary | ICD-10-CM | POA: Diagnosis not present

## 2023-12-21 DIAGNOSIS — Z3042 Encounter for surveillance of injectable contraceptive: Secondary | ICD-10-CM | POA: Diagnosis not present

## 2023-12-21 DIAGNOSIS — Z1151 Encounter for screening for human papillomavirus (HPV): Secondary | ICD-10-CM

## 2023-12-21 DIAGNOSIS — Z124 Encounter for screening for malignant neoplasm of cervix: Secondary | ICD-10-CM

## 2023-12-21 DIAGNOSIS — Z202 Contact with and (suspected) exposure to infections with a predominantly sexual mode of transmission: Secondary | ICD-10-CM

## 2023-12-21 MED ORDER — MEDROXYPROGESTERONE ACETATE 150 MG/ML IM SUSY: PREFILLED_SYRINGE | INTRAMUSCULAR | Status: AC

## 2023-12-23 LAB — IGP,CTNGTV,APT HPV,RFX16/18,45
Chlamydia, Nuc. Acid Amp: NEGATIVE
Gonococcus, Nuc. Acid Amp: NEGATIVE
HPV Aptima: NEGATIVE
PAP Smear Comment: 0
Trich vag by NAA: NEGATIVE

## 2023-12-23 NOTE — Progress Notes (Signed)
 Complete physical exam  Patient: Elizabeth Frey   DOB: January 16, 1991   33 y.o. Female  MRN: [id number]  Subjective:    Chief Complaint  Patient presents with   Gynecologic Exam    Elizabeth Frey is a 33 y.o. female who presents today for a complete physical exam. She reports consuming a general diet.  She generally feels fairly well. She reports sleeping fairly well. She does have additional problems to discuss today.   Concerned about a possible yeast infection.  Says she gets them frequently, she suspects due to her dietary intake.     Most recent depression screenings:    11/04/2017   10:39 AM  PHQ 2/9 Scores  PHQ - 2 Score 0  PHQ- 9 Score 0      Past Medical History:  Diagnosis Date   Gallstones    Headache(784.0)    non-migraine; every 1-2 days   History of bilateral breast reduction surgery 07/15/2017   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    Past Surgical History:  Procedure Laterality Date   BREAST REDUCTION SURGERY  08/24/2011   Procedure: MAMMARY REDUCTION BILATERAL (BREAST);  Surgeon: Ronal DELENA Mage, MD;  Location: [facility];  Service: Plastics;  Laterality: Bilateral;   BREAST SURGERY     CESAREAN SECTION     CHOLECYSTECTOMY N/A 07/30/2018   Procedure: LAPAROSCOPIC CHOLECYSTECTOMY;  Surgeon: Tye Millet, DO;  Location: [facility];  Service: General;  Laterality: N/A;   DILATION AND CURETTAGE OF UTERUS  2008   LIPOSUCTION EXTREMITIES     WISDOM TOOTH EXTRACTION      No family history on file.  Social History   Socioeconomic History   Marital status: Single    Spouse name: Not on file   Number of children: Not on file   Years of education: Not on file   Highest education level: Not on file  Occupational History   Not on file  Tobacco Use   Smoking status: Former   Smokeless tobacco: Never  Vaping Use   Vaping status: Never Used  Substance and Sexual Activity   Alcohol use: No   Drug use: No   Sexual activity: Yes    Birth  control/protection: Other-see comments, Injection  Other Topics Concern   Not on file  Social History Narrative   Not on file   Social Drivers of Health   Financial Resource Strain: Not on file  Food Insecurity: Not on file  Transportation Needs: Not on file  Physical Activity: Not on file  Stress: Not on file  Social Connections: Not on file  Intimate Partner Violence: Not on file    Outpatient Medications Prior to Visit  Medication Sig   busPIRone (BUSPAR) 5 MG tablet Take 5 mg by mouth 2 (two) times daily.   medroxyPROGESTERone  (DEPO-PROVERA ) 150 MG/ML injection Inject 1 mL (150 mg total) into the muscle every 3 (three) months.   nortriptyline (PAMELOR) 10 MG capsule Take 10 mg by mouth 2 (two) times daily.   omeprazole (PRILOSEC) 40 MG capsule TAKE 1 CAPSULE BY MOUTH EVERY DAY   rizatriptan (MAXALT) 10 MG tablet Take 10 mg by mouth as needed for migraine.   Vitamin D , Ergocalciferol , (DRISDOL ) 1.25 MG (50000 UNIT) CAPS capsule TAKE 1 CAPSULE BY MOUTH ONE TIME PER WEEK   No facility-administered medications prior to visit.    Review of Systems  All other systems reviewed and are negative.       Objective:     BP 117/76  Pulse 91   Ht 5' 3 (1.6 m)   Wt 142 lb 9.6 oz (64.7 kg)   SpO2 98%   BMI 25.26 kg/m    Physical Exam Vitals and nursing note reviewed.  Constitutional:      Appearance: Normal appearance. She is normal weight.  HENT:     Head: Normocephalic.  Eyes:     Extraocular Movements: Extraocular movements intact.     Conjunctiva/sclera: Conjunctivae normal.     Pupils: Pupils are equal, round, and reactive to light.  Cardiovascular:     Rate and Rhythm: Normal rate.  Pulmonary:     Effort: Pulmonary effort is normal.  Neurological:     General: No focal deficit present.     Mental Status: She is alert and oriented to person, place, and time. Mental status is at baseline.  Psychiatric:        Mood and Affect: Mood normal.        Behavior:  Behavior normal.        Thought Content: Thought content normal.      No results found for any visits on 12/21/23.  No results found for this or any previous visit (from the past 2160 hours).      Assessment & Plan:    Routine Health Maintenance and Physical Exam   There is no immunization history on file for this patient.  Health Maintenance  Topic Date Due   DTaP/Tdap/Td (1 - Tdap) Never done   Hepatitis B Vaccines (1 of 3 - 19+ 3-dose series) Never done   HPV VACCINES (1 - 3-dose SCDM series) Never done   Cervical Cancer Screening (HPV/Pap Cotest)  04/22/2020   COVID-19 Vaccine (1 - 2024-25 season) Never done   INFLUENZA VACCINE  01/13/2024   Hepatitis C Screening  Completed   HIV Screening  Completed   Meningococcal B Vaccine  Aged Out    Discussed health benefits of physical activity, and encouraged her to engage in regular exercise appropriate for her age and condition.  Assessment & Plan Encounter for surveillance of injectable contraceptive Depo shot given in office today.  Repeat in 3 months.  Vaginal Pap smear Screening for human papillomavirus (HPV) Exposure to sexually transmitted disease (STD) Acute vaginitis Screening for cervical cancer Abnormal cervical Papanicolaou smear, unspecified abnormal pap finding Routine general medical examination at a health care facility Encounter for well woman exam with routine gynecological exam Pap smear in office today.  Will call with results when available.   Sending meds for possible yeast infection.  Pt will let me know if these do not resolve her symptoms.     Return in about 3 months (around 03/22/2024).     ALAN CHRISTELLA ARRANT, FNP  12/21/2023   This document may have been prepared by [facility] Voice Recognition software and as such may include unintentional dictation errors.

## 2024-01-02 ENCOUNTER — Ambulatory Visit: Payer: Self-pay

## 2024-03-27 ENCOUNTER — Encounter: Payer: Self-pay | Admitting: Family

## 2024-03-27 ENCOUNTER — Ambulatory Visit: Admitting: Family

## 2024-03-27 VITALS — BP 106/76 | HR 59 | Ht 63.0 in | Wt 148.0 lb

## 2024-03-27 DIAGNOSIS — R1084 Generalized abdominal pain: Secondary | ICD-10-CM

## 2024-03-27 DIAGNOSIS — K8050 Calculus of bile duct without cholangitis or cholecystitis without obstruction: Secondary | ICD-10-CM | POA: Diagnosis not present

## 2024-03-27 DIAGNOSIS — Z131 Encounter for screening for diabetes mellitus: Secondary | ICD-10-CM

## 2024-03-27 DIAGNOSIS — Z3042 Encounter for surveillance of injectable contraceptive: Secondary | ICD-10-CM | POA: Diagnosis not present

## 2024-03-27 DIAGNOSIS — Z0130 Encounter for examination of blood pressure without abnormal findings: Secondary | ICD-10-CM

## 2024-03-27 MED ORDER — MEDROXYPROGESTERONE ACETATE 150 MG/ML IM SUSY: PREFILLED_SYRINGE | INTRAMUSCULAR | Status: AC

## 2024-03-27 MED ORDER — CHOLESTYRAMINE 4 G PO PACK: 4.0000 g | PACK | Freq: Two times a day (BID) | ORAL | 11 refills | Status: AC

## 2024-03-27 NOTE — Progress Notes (Signed)
 Acute Office Visit  Subjective:     Patient ID: Elizabeth Frey, female    DOB: [birth year], 33 y.o.   MRN: [id number]  Patient is in today for  Chief Complaint  Patient presents with   Follow-up    Discuss stomach issues    Reports ongoing abdominal pain described as sharp and severe enough to stop activities. Pain episodes feel like mini contractions with associated hot flashes and sweating. Episodes are intermittent, lasting approximately 1-2 minutes before gradually resolving. Describes sensation of food not being properly digested, with symptoms resembling pre-labor cramping or food poisoning. Stomach sensitivity noted with eating, though no specific food triggers identified. Symptoms have been persistent and bothersome. Previously discontinued medications to determine if they were contributing factors, but symptoms continue. Episodes can occur immediately after eating or delayed by approximately one hour.     Review of Systems  Gastrointestinal:  Positive for abdominal pain, diarrhea and nausea.  All other systems reviewed and are negative.       Objective:    BP 106/76   Pulse (!) 59   Ht 5' 3 (1.6 m)   Wt 148 lb (67.1 kg)   SpO2 99%   BMI 26.22 kg/m   Physical Exam Vitals and nursing note reviewed.  Constitutional:      Appearance: Normal appearance. She is normal weight.  HENT:     Head: Normocephalic and atraumatic.  Eyes:     Extraocular Movements: Extraocular movements intact.     Conjunctiva/sclera: Conjunctivae normal.     Pupils: Pupils are equal, round, and reactive to light.  Cardiovascular:     Rate and Rhythm: Normal rate and regular rhythm.     Pulses: Normal pulses.     Heart sounds: Normal heart sounds.  Pulmonary:     Effort: Pulmonary effort is normal.     Breath sounds: Normal breath sounds.  Musculoskeletal:     Cervical back: Normal range of motion.  Neurological:     General: No focal deficit present.     Mental Status: She is  alert and oriented to person, place, and time. Mental status is at baseline.  Psychiatric:        Mood and Affect: Mood normal.        Behavior: Behavior normal.        Thought Content: Thought content normal.        Judgment: Judgment normal.     No results found for any visits on 03/27/24.  No results found for this or any previous visit (from the past 2160 hours).  Allergies as of 03/27/2024   No Known Allergies      Medication List        Accurate as of March 27, 2024  2:40 PM. If you have any questions, ask your nurse or doctor.          busPIRone 5 MG tablet Commonly known as: BUSPAR Take 5 mg by mouth 2 (two) times daily.   medroxyPROGESTERone  150 MG/ML injection Commonly known as: DEPO-PROVERA  Inject 1 mL (150 mg total) into the muscle every 3 (three) months.   nortriptyline 10 MG capsule Commonly known as: PAMELOR Take 10 mg by mouth 2 (two) times daily.   omeprazole 40 MG capsule Commonly known as: PRILOSEC TAKE 1 CAPSULE BY MOUTH EVERY DAY   rizatriptan 10 MG tablet Commonly known as: MAXALT Take 10 mg by mouth as needed for migraine.   Vitamin D  (Ergocalciferol ) 1.25 MG (50000 UNIT) Caps capsule  Commonly known as: DRISDOL  TAKE 1 CAPSULE BY MOUTH ONE TIME PER WEEK            Assessment & Plan Encounter for surveillance of injectable contraceptive Depo shot given in office today.  Repeat in 3 months.   Biliary colic Generalized abdominal pain - Post-cholecystectomy digestive issues: Likely related to inability to properly process dietary fats following gallbladder removal - Cholestyramine powder prescribed, sent to pharmacy - Instructions: Start with once daily dosing, mix powder in water or juice - May increase to twice daily or up to four times daily as needed - Dietary counseling: Maintain low-fat diet to minimize symptoms - Follow-up in 3 months or sooner if symptoms persist or worsen - Contact if medication ineffective before next  scheduled visit   Return in about 3 months (around 06/27/2024).  Total time spent: 20 minutes  ALAN CHRISTELLA ARRANT, FNP  03/27/2024   This document may have been prepared by [facility] Voice Recognition software and as such may include unintentional dictation errors.

## 2024-03-28 ENCOUNTER — Encounter: Payer: Self-pay | Admitting: Family

## 2024-04-03 ENCOUNTER — Encounter: Payer: Self-pay | Admitting: Family

## 2024-04-03 NOTE — Assessment & Plan Note (Signed)
-   Post-cholecystectomy digestive issues: Likely related to inability to properly process dietary fats following gallbladder removal - Cholestyramine powder prescribed, sent to pharmacy - Instructions: Start with once daily dosing, mix powder in water or juice - May increase to twice daily or up to four times daily as needed - Dietary counseling: Maintain low-fat diet to minimize symptoms - Follow-up in 3 months or sooner if symptoms persist or worsen - Contact if medication ineffective before next scheduled visit

## 2024-06-27 ENCOUNTER — Encounter: Payer: Self-pay | Admitting: Family

## 2024-06-27 ENCOUNTER — Ambulatory Visit: Admitting: Family

## 2024-06-27 VITALS — BP 122/82 | HR 75 | Ht 63.0 in | Wt 152.8 lb

## 2024-06-27 DIAGNOSIS — R5383 Other fatigue: Secondary | ICD-10-CM | POA: Diagnosis not present

## 2024-06-27 DIAGNOSIS — Z3042 Encounter for surveillance of injectable contraceptive: Secondary | ICD-10-CM | POA: Diagnosis not present

## 2024-06-27 DIAGNOSIS — E663 Overweight: Secondary | ICD-10-CM

## 2024-06-27 DIAGNOSIS — R7303 Prediabetes: Secondary | ICD-10-CM | POA: Diagnosis not present

## 2024-06-27 DIAGNOSIS — E782 Mixed hyperlipidemia: Secondary | ICD-10-CM | POA: Insufficient documentation

## 2024-06-27 DIAGNOSIS — G35D Multiple sclerosis, unspecified: Secondary | ICD-10-CM | POA: Diagnosis not present

## 2024-06-27 DIAGNOSIS — E538 Deficiency of other specified B group vitamins: Secondary | ICD-10-CM | POA: Diagnosis not present

## 2024-06-27 DIAGNOSIS — I10 Essential (primary) hypertension: Secondary | ICD-10-CM | POA: Diagnosis not present

## 2024-06-27 DIAGNOSIS — N760 Acute vaginitis: Secondary | ICD-10-CM | POA: Insufficient documentation

## 2024-06-27 DIAGNOSIS — Z6828 Body mass index (BMI) 28.0-28.9, adult: Secondary | ICD-10-CM | POA: Diagnosis not present

## 2024-06-27 DIAGNOSIS — E559 Vitamin D deficiency, unspecified: Secondary | ICD-10-CM

## 2024-06-27 MED ORDER — MEDROXYPROGESTERONE ACETATE 150 MG/ML IM SUSY: PREFILLED_SYRINGE | INTRAMUSCULAR | Status: AC

## 2024-06-27 NOTE — Progress Notes (Signed)
 "  Established Patient Office Visit  Subjective:  Patient ID: Elizabeth Frey, female    DOB: Sep 28, 1990  Age: 34 y.o. MRN: [id number]  Chief Complaint  Patient presents with   Follow-up    3 month follow up    Patient is here today for her 3 months follow up.  She has been feeling fairly well since last appointment.   She does have additional concerns to discuss today. She reports abdominal symptoms has resolved since making dietary changes. She states she does not need to Questran  daily unless she eats something that is higher is fat content.  She thinks she has vaginal yeast infection. She reports vaginal itching and malodor especially after being sexually active with her husband. Will check Nuswab today and treat as needed. Reinforced need to void and stay clean and dry after sexual intercourse.  She reports frequent night time sweats and states this has been a problem for a long time. She is unsure of family hx of when menopause symptoms. Suggest patient taking Buspar twice daily but move evening dose to lunch time and see if night sweats improve. Will check some routine blood work today and discussed checking hormone levels at next visit if symptoms persist. She is due to get Depo shot at this time and does not have monthly cycles. Also suggested patient keep journal of when hot flashes occur and bring with her to her next appointment.  Labs are due today and patient is fasting. She needs refills.   I have reviewed her active problem list, medication list, allergies, family history, social history, health maintenance, notes from last encounter, lab results  for her appointment today.      No other concerns at this time.   Past Medical History:  Diagnosis Date   Essential hypertension, benign 06/27/2024   Blood pressure well controlled with current medications.  Continue current therapy.  Will reassess at follow up.   Gallstones    Headache(784.0)    non-migraine; every  1-2 days   History of bilateral breast reduction surgery 07/15/2017   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    Past Surgical History:  Procedure Laterality Date   BREAST REDUCTION SURGERY  08/24/2011   Procedure: MAMMARY REDUCTION BILATERAL (BREAST);  Surgeon: Ronal DELENA Mage, MD;  Location: [facility];  Service: Plastics;  Laterality: Bilateral;   BREAST SURGERY     CESAREAN SECTION     CHOLECYSTECTOMY N/A 07/30/2018   Procedure: LAPAROSCOPIC CHOLECYSTECTOMY;  Surgeon: Tye Millet, DO;  Location: [facility];  Service: General;  Laterality: N/A;   DILATION AND CURETTAGE OF UTERUS  2008   LIPOSUCTION EXTREMITIES     WISDOM TOOTH EXTRACTION      Social History   Socioeconomic History   Marital status: Single    Spouse name: Not on file   Number of children: Not on file   Years of education: Not on file   Highest education level: Not on file  Occupational History   Not on file  Tobacco Use   Smoking status: Former   Smokeless tobacco: Never  Vaping Use   Vaping status: Never Used  Substance and Sexual Activity   Alcohol use: No   Drug use: No   Sexual activity: Yes    Birth control/protection: Other-see comments, Injection  Other Topics Concern   Not on file  Social History Narrative   Not on file   Social Drivers of Health   Tobacco Use: Medium Risk (06/27/2024)   Patient History  Smoking Tobacco Use: Former    Smokeless Tobacco Use: Never    Passive Exposure: Not on Actuary Strain: Not on file  Food Insecurity: Not on file  Transportation Needs: Not on file  Physical Activity: Not on file  Stress: Not on file  Social Connections: Not on file  Intimate Partner Violence: Not on file  Depression (EYV7-0): Not on file  Alcohol Screen: Not on file  Housing: Not on file  Utilities: Not on file  Health Literacy: Not on file    History reviewed. No pertinent family history.  Allergies[1]  Review of Systems  Constitutional:  Positive for  diaphoresis. Negative for malaise/fatigue.  HENT: Negative.    Eyes:  Negative for blurred vision and pain.  Respiratory:  Negative for cough and shortness of breath.   Cardiovascular:  Negative for chest pain, palpitations, claudication and leg swelling.  Gastrointestinal:  Negative for abdominal pain, blood in stool, constipation, diarrhea, nausea and vomiting.  Genitourinary:  Negative for dysuria, frequency and urgency.       Vaginal itching and malodor  Musculoskeletal: Negative.   Skin: Negative.   Neurological:  Negative for dizziness, tingling, sensory change and headaches.  Endo/Heme/Allergies: Negative.   Psychiatric/Behavioral:  The patient is nervous/anxious.        Objective:   BP 122/82   Pulse 75   Ht 5' 3 (1.6 m)   Wt 152 lb 12.8 oz (69.3 kg)   SpO2 99%   BMI 27.07 kg/m   Vitals:   06/27/24 1012  BP: 122/82  Pulse: 75  Height: 5' 3 (1.6 m)  Weight: 152 lb 12.8 oz (69.3 kg)  SpO2: 99%  BMI (Calculated): 27.07    Physical Exam Vitals and nursing note reviewed.  Constitutional:      Appearance: Normal appearance.  HENT:     Head: Normocephalic.  Eyes:     Extraocular Movements: Extraocular movements intact.     Pupils: Pupils are equal, round, and reactive to light.  Cardiovascular:     Rate and Rhythm: Normal rate and regular rhythm.     Pulses: Normal pulses.     Heart sounds: Normal heart sounds. No murmur heard. Pulmonary:     Effort: Pulmonary effort is normal. No respiratory distress.     Breath sounds: Normal breath sounds.  Abdominal:     General: There is no distension.     Tenderness: There is no abdominal tenderness.  Musculoskeletal:        General: No tenderness. Normal range of motion.     Cervical back: Normal range of motion and neck supple.     Right lower leg: No edema.     Left lower leg: No edema.  Skin:    General: Skin is warm and dry.     Coloration: Skin is not jaundiced.     Findings: No erythema.  Neurological:      General: No focal deficit present.     Mental Status: She is alert and oriented to person, place, and time.  Psychiatric:        Mood and Affect: Mood normal.        Speech: Speech normal.        Behavior: Behavior is cooperative.        Cognition and Memory: Memory is not impaired.      No results found for any visits on 06/27/24.  No results found for this or any previous visit (from the past 2160 hours).  Assessment & Plan:   Assessment & Plan MS (multiple sclerosis) - Stay physically active and eat healthy diet. Vitamin D  deficiency B12 deficiency due to diet Other fatigue Overweight with body mass index (BMI) of 28 to 28.9 in adult - Check labs today - Supplementation recommended based off lab results and will notify patient at that time  Essential hypertension, benign Prediabetes Mixed hyperlipidemia - Continue healthy diet and exercise as tolerated. - Continue medications as prescribed. - Check labs today  Acute vaginitis - Collect Nuswab today and treat as needed. - Reinforced post intercourse healthy personal hygiene    Return in about 3 months (around 09/25/2024) for FU and depo shot.   Total time spent: 30 minutes  Oddis DELENA Cain, FNP  06/27/2024   This document may have been prepared by [facility] Voice Recognition software and as such may include unintentional dictation errors.     [1] No Known Allergies  "

## 2024-06-27 NOTE — Assessment & Plan Note (Signed)
-   Continue healthy diet and exercise as tolerated. - Continue medications as prescribed. - Check labs today

## 2024-06-27 NOTE — Assessment & Plan Note (Signed)
-   Check labs today - Supplementation recommended based off lab results and will notify patient at that time

## 2024-06-27 NOTE — Assessment & Plan Note (Signed)
-   Stay physically active and eat healthy diet.

## 2024-06-27 NOTE — Assessment & Plan Note (Signed)
-   Collect Nuswab today and treat as needed. - Reinforced post intercourse healthy personal hygiene

## 2024-06-28 ENCOUNTER — Ambulatory Visit: Payer: Self-pay

## 2024-06-28 LAB — LIPID PANEL
Chol/HDL Ratio: 4.1 ratio (ref 0.0–4.4)
Cholesterol, Total: 163 mg/dL (ref 100–199)
HDL: 40 mg/dL
LDL Chol Calc (NIH): 106 mg/dL — ABNORMAL HIGH (ref 0–99)
Triglycerides: 91 mg/dL (ref 0–149)
VLDL Cholesterol Cal: 17 mg/dL (ref 5–40)

## 2024-06-28 LAB — CMP14+EGFR
ALT: 17 IU/L (ref 0–32)
AST: 15 IU/L (ref 0–40)
Albumin: 4.3 g/dL (ref 3.9–4.9)
Alkaline Phosphatase: 100 IU/L (ref 41–116)
BUN/Creatinine Ratio: 9 (ref 9–23)
BUN: 7 mg/dL (ref 6–20)
Bilirubin Total: 0.5 mg/dL (ref 0.0–1.2)
CO2: 19 mmol/L — ABNORMAL LOW (ref 20–29)
Calcium: 9.4 mg/dL (ref 8.7–10.2)
Chloride: 106 mmol/L (ref 96–106)
Creatinine, Ser: 0.79 mg/dL (ref 0.57–1.00)
Globulin, Total: 3.1 g/dL (ref 1.5–4.5)
Glucose: 90 mg/dL (ref 70–99)
Potassium: 4 mmol/L (ref 3.5–5.2)
Sodium: 141 mmol/L (ref 134–144)
Total Protein: 7.4 g/dL (ref 6.0–8.5)
eGFR: 100 mL/min/1.73

## 2024-06-28 LAB — CBC WITH DIFFERENTIAL/PLATELET
Basophils Absolute: 0 x10E3/uL (ref 0.0–0.2)
Basos: 0 %
EOS (ABSOLUTE): 0.1 x10E3/uL (ref 0.0–0.4)
Eos: 1 %
Hematocrit: 43 % (ref 34.0–46.6)
Hemoglobin: 13.8 g/dL (ref 11.1–15.9)
Immature Grans (Abs): 0.1 x10E3/uL (ref 0.0–0.1)
Immature Granulocytes: 1 %
Lymphocytes Absolute: 3.1 x10E3/uL (ref 0.7–3.1)
Lymphs: 32 %
MCH: 33.3 pg — ABNORMAL HIGH (ref 26.6–33.0)
MCHC: 32.1 g/dL (ref 31.5–35.7)
MCV: 104 fL — ABNORMAL HIGH (ref 79–97)
Monocytes Absolute: 0.6 x10E3/uL (ref 0.1–0.9)
Monocytes: 6 %
Neutrophils Absolute: 5.7 x10E3/uL (ref 1.4–7.0)
Neutrophils: 60 %
Platelets: 151 x10E3/uL (ref 150–450)
RBC: 4.15 x10E6/uL (ref 3.77–5.28)
RDW: 12 % (ref 11.7–15.4)
WBC: 9.6 x10E3/uL (ref 3.4–10.8)

## 2024-06-28 LAB — HEMOGLOBIN A1C
Est. average glucose Bld gHb Est-mCnc: 100 mg/dL
Hgb A1c MFr Bld: 5.1 % (ref 4.8–5.6)

## 2024-06-28 LAB — VITAMIN D 25 HYDROXY (VIT D DEFICIENCY, FRACTURES): Vit D, 25-Hydroxy: 28.1 ng/mL — ABNORMAL LOW (ref 30.0–100.0)

## 2024-06-28 LAB — VITAMIN B12: Vitamin B-12: 336 pg/mL (ref 232–1245)

## 2024-06-28 LAB — MAGNESIUM: Magnesium: 1.8 mg/dL (ref 1.6–2.3)

## 2024-06-29 LAB — NUSWAB VG PLUS+MYCOPLASMAS,NAA
Atopobium vaginae: HIGH {score} — AB
Candida albicans, NAA: NEGATIVE
Candida glabrata, NAA: NEGATIVE
Chlamydia trachomatis, NAA: NEGATIVE
Mycoplasma genitalium NAA: NEGATIVE
Mycoplasma hominis NAA: NEGATIVE
Neisseria gonorrhoeae, NAA: NEGATIVE
Trich vag by NAA: NEGATIVE
Ureaplasma spp NAA: NEGATIVE

## 2024-06-29 LAB — TSH+T4F+T3FREE
Free T4: 1.24 ng/dL (ref 0.82–1.77)
T3, Free: 3.3 pg/mL (ref 2.0–4.4)
TSH: 1.82 u[IU]/mL (ref 0.450–4.500)

## 2024-06-29 NOTE — Progress Notes (Signed)
 Patient notified

## 2024-06-30 MED ORDER — METRONIDAZOLE 500 MG PO TABS: 500.0000 mg | ORAL_TABLET | Freq: Two times a day (BID) | ORAL | 0 refills | Status: AC

## 2024-09-25 ENCOUNTER — Ambulatory Visit: Admitting: Family
